# Patient Record
Sex: Female | Born: 1955 | Race: Black or African American | Hispanic: No | Smoking: Never smoker
Health system: Southern US, Community
[De-identification: ages and names within clinical notes are randomized; demographics above are authoritative.]

## PROBLEM LIST (undated history)

## (undated) DIAGNOSIS — W3400XA Accidental discharge from unspecified firearms or gun, initial encounter: Secondary | ICD-10-CM

## (undated) DIAGNOSIS — M722 Plantar fascial fibromatosis: Secondary | ICD-10-CM

## (undated) DIAGNOSIS — R519 Headache, unspecified: Secondary | ICD-10-CM

## (undated) DIAGNOSIS — S0560XA Penetrating wound without foreign body of unspecified eyeball, initial encounter: Secondary | ICD-10-CM

## (undated) DIAGNOSIS — E669 Obesity, unspecified: Secondary | ICD-10-CM

## (undated) DIAGNOSIS — M8580 Other specified disorders of bone density and structure, unspecified site: Secondary | ICD-10-CM

## (undated) DIAGNOSIS — A6000 Herpesviral infection of urogenital system, unspecified: Secondary | ICD-10-CM

## (undated) DIAGNOSIS — R51 Headache: Secondary | ICD-10-CM

## (undated) DIAGNOSIS — G43019 Migraine without aura, intractable, without status migrainosus: Principal | ICD-10-CM

## (undated) DIAGNOSIS — M1990 Unspecified osteoarthritis, unspecified site: Secondary | ICD-10-CM

## (undated) DIAGNOSIS — S161XXA Strain of muscle, fascia and tendon at neck level, initial encounter: Secondary | ICD-10-CM

## (undated) DIAGNOSIS — G43909 Migraine, unspecified, not intractable, without status migrainosus: Secondary | ICD-10-CM

## (undated) DIAGNOSIS — E559 Vitamin D deficiency, unspecified: Secondary | ICD-10-CM

## (undated) DIAGNOSIS — I10 Essential (primary) hypertension: Secondary | ICD-10-CM

## (undated) DIAGNOSIS — H5440 Blindness, one eye, unspecified eye: Secondary | ICD-10-CM

## (undated) HISTORY — DX: Accidental discharge from unspecified firearms or gun, initial encounter: W34.00XA

## (undated) HISTORY — DX: Migraine, unspecified, not intractable, without status migrainosus: G43.909

## (undated) HISTORY — DX: Penetrating wound without foreign body of unspecified eyeball, initial encounter: S05.60XA

## (undated) HISTORY — PX: APPENDECTOMY: SHX54

## (undated) HISTORY — DX: Headache: R51

## (undated) HISTORY — DX: Strain of muscle, fascia and tendon at neck level, initial encounter: S16.1XXA

## (undated) HISTORY — PX: ABDOMINAL HYSTERECTOMY: SHX81

## (undated) HISTORY — DX: Headache, unspecified: R51.9

## (undated) HISTORY — DX: Unspecified osteoarthritis, unspecified site: M19.90

## (undated) HISTORY — DX: Other specified disorders of bone density and structure, unspecified site: M85.80

## (undated) HISTORY — DX: Blindness, one eye, unspecified eye: H54.40

## (undated) HISTORY — DX: Herpesviral infection of urogenital system, unspecified: A60.00

## (undated) HISTORY — DX: Plantar fascial fibromatosis: M72.2

## (undated) HISTORY — DX: Obesity, unspecified: E66.9

## (undated) HISTORY — DX: Vitamin D deficiency, unspecified: E55.9

## (undated) HISTORY — DX: Migraine without aura, intractable, without status migrainosus: G43.019

---

## 1998-08-17 ENCOUNTER — Ambulatory Visit (HOSPITAL_COMMUNITY): Admission: RE | Admit: 1998-08-17 | Discharge: 1998-08-17 | Payer: Self-pay | Admitting: Family Medicine

## 1998-08-17 ENCOUNTER — Encounter: Payer: Self-pay | Admitting: Family Medicine

## 1998-12-14 ENCOUNTER — Inpatient Hospital Stay (HOSPITAL_COMMUNITY): Admission: AD | Admit: 1998-12-14 | Discharge: 1998-12-24 | Payer: Self-pay | Admitting: Obstetrics and Gynecology

## 1998-12-27 ENCOUNTER — Inpatient Hospital Stay (HOSPITAL_COMMUNITY): Admission: AD | Admit: 1998-12-27 | Discharge: 1998-12-27 | Payer: Self-pay | Admitting: Obstetrics and Gynecology

## 1998-12-30 ENCOUNTER — Inpatient Hospital Stay (HOSPITAL_COMMUNITY): Admission: AD | Admit: 1998-12-30 | Discharge: 1999-01-19 | Payer: Self-pay | Admitting: Obstetrics & Gynecology

## 1999-01-12 ENCOUNTER — Encounter: Payer: Self-pay | Admitting: Obstetrics & Gynecology

## 1999-01-17 ENCOUNTER — Encounter: Payer: Self-pay | Admitting: Obstetrics and Gynecology

## 1999-01-20 ENCOUNTER — Encounter (HOSPITAL_COMMUNITY): Admission: RE | Admit: 1999-01-20 | Discharge: 1999-04-20 | Payer: Self-pay | Admitting: *Deleted

## 2000-01-12 ENCOUNTER — Ambulatory Visit (HOSPITAL_COMMUNITY): Admission: RE | Admit: 2000-01-12 | Discharge: 2000-01-12 | Payer: Self-pay | Admitting: Family Medicine

## 2000-01-12 ENCOUNTER — Encounter: Payer: Self-pay | Admitting: Family Medicine

## 2000-05-11 ENCOUNTER — Observation Stay: Admission: EM | Admit: 2000-05-11 | Discharge: 2000-05-13 | Payer: Self-pay | Admitting: Emergency Medicine

## 2000-05-11 ENCOUNTER — Encounter (INDEPENDENT_AMBULATORY_CARE_PROVIDER_SITE_OTHER): Payer: Self-pay | Admitting: Specialist

## 2000-05-11 ENCOUNTER — Encounter: Payer: Self-pay | Admitting: Emergency Medicine

## 2001-01-09 ENCOUNTER — Other Ambulatory Visit: Admission: RE | Admit: 2001-01-09 | Discharge: 2001-01-09 | Payer: Self-pay | Admitting: Obstetrics and Gynecology

## 2001-01-18 ENCOUNTER — Ambulatory Visit (HOSPITAL_COMMUNITY): Admission: RE | Admit: 2001-01-18 | Discharge: 2001-01-18 | Payer: Self-pay | Admitting: Obstetrics and Gynecology

## 2007-12-16 ENCOUNTER — Other Ambulatory Visit: Admission: RE | Admit: 2007-12-16 | Discharge: 2007-12-16 | Payer: Self-pay | Admitting: Family Medicine

## 2008-12-17 ENCOUNTER — Other Ambulatory Visit: Admission: RE | Admit: 2008-12-17 | Discharge: 2008-12-17 | Payer: Self-pay | Admitting: Family Medicine

## 2009-04-27 ENCOUNTER — Observation Stay (HOSPITAL_COMMUNITY): Admission: EM | Admit: 2009-04-27 | Discharge: 2009-04-28 | Payer: Self-pay | Admitting: Emergency Medicine

## 2009-04-28 ENCOUNTER — Encounter (INDEPENDENT_AMBULATORY_CARE_PROVIDER_SITE_OTHER): Payer: Self-pay | Admitting: Internal Medicine

## 2011-03-14 LAB — D-DIMER, QUANTITATIVE: D-Dimer, Quant: 0.27 ug/mL-FEU (ref 0.00–0.48)

## 2011-03-14 LAB — MAGNESIUM: Magnesium: 2.2 mg/dL (ref 1.5–2.5)

## 2011-03-14 LAB — DIFFERENTIAL
Basophils Absolute: 0.1 10*3/uL (ref 0.0–0.1)
Basophils Absolute: 0.1 10*3/uL (ref 0.0–0.1)
Eosinophils Absolute: 0.1 10*3/uL (ref 0.0–0.7)
Eosinophils Relative: 1 % (ref 0–5)
Lymphocytes Relative: 62 % — ABNORMAL HIGH (ref 12–46)
Monocytes Absolute: 0.4 10*3/uL (ref 0.1–1.0)
Neutro Abs: 1.5 10*3/uL — ABNORMAL LOW (ref 1.7–7.7)
Neutrophils Relative %: 28 % — ABNORMAL LOW (ref 43–77)

## 2011-03-14 LAB — BASIC METABOLIC PANEL
CO2: 28 mEq/L (ref 19–32)
GFR calc non Af Amer: >60 mL/min (ref >60)
Glucose, Bld: 111 mg/dL — ABNORMAL HIGH (ref 70–99)
Potassium: 4 mEq/L (ref 3.5–5.1)
Sodium: 144 mEq/L (ref 135–145)

## 2011-03-14 LAB — COMPREHENSIVE METABOLIC PANEL
ALT: 10 U/L (ref 0–35)
AST: 13 U/L (ref 0–37)
CO2: 25 mEq/L (ref 19–32)
Chloride: 108 mEq/L (ref 96–112)
Creatinine, Ser: 1.06 mg/dL (ref 0.4–1.2)
GFR calc Af Amer: >60 mL/min (ref >60)
GFR calc non Af Amer: 55 mL/min — ABNORMAL LOW (ref >60)
Total Bilirubin: 0.5 mg/dL (ref 0.3–1.2)

## 2011-03-14 LAB — IRON AND TIBC
Iron: 84 ug/dL (ref 42–135)
Saturation Ratios: 29 % (ref 20–55)
TIBC: 294 ug/dL (ref 250–470)
UIBC: 210 ug/dL

## 2011-03-14 LAB — LIPID PANEL
HDL: 26 mg/dL — ABNORMAL LOW (ref >39)
LDL Cholesterol: 113 mg/dL — ABNORMAL HIGH (ref 0–99)
Total CHOL/HDL Ratio: 6.1 RATIO
Triglycerides: 95 mg/dL (ref <150)
VLDL: 19 mg/dL (ref 0–40)

## 2011-03-14 LAB — CBC
Hemoglobin: 11 g/dL — ABNORMAL LOW (ref 12.0–15.0)
Hemoglobin: 11.7 g/dL — ABNORMAL LOW (ref 12.0–15.0)
MCV: 85.1 fL (ref 78.0–100.0)
RBC: 4.05 MIL/uL (ref 3.87–5.11)
RDW: 14 % (ref 11.5–15.5)
WBC: 5.4 10*3/uL (ref 4.0–10.5)

## 2011-03-14 LAB — CARDIAC PANEL(CRET KIN+CKTOT+MB+TROPI)
CK, MB: 0.9 ng/mL (ref 0.3–4.0)
Relative Index: 0.6 (ref 0.0–2.5)
Relative Index: 0.7 (ref 0.0–2.5)
Total CK: 147 U/L (ref 7–177)
Troponin I: 0.02 ng/mL (ref 0.00–0.06)

## 2011-03-14 LAB — URINALYSIS, MICROSCOPIC ONLY
Bilirubin Urine: NEGATIVE
Ketones, ur: NEGATIVE mg/dL
Nitrite: NEGATIVE
Urobilinogen, UA: 1 mg/dL (ref 0.0–1.0)

## 2011-03-14 LAB — LIPASE, BLOOD: Lipase: 21 U/L (ref 11–59)

## 2011-03-14 LAB — TSH: TSH: 1.123 u[IU]/mL (ref 0.350–4.500)

## 2011-03-14 LAB — POCT CARDIAC MARKERS
Myoglobin, poc: 75.5 ng/mL (ref 12–200)
Troponin i, poc: <0.05 ng/mL (ref 0.00–0.09)

## 2011-03-14 LAB — URINE CULTURE: Colony Count: >=100000

## 2011-03-14 LAB — BRAIN NATRIURETIC PEPTIDE: Pro B Natriuretic peptide (BNP): <30 pg/mL (ref 0.0–100.0)

## 2011-04-18 NOTE — H&P (Signed)
NAMEAMYRIE, ILLINGWORTH NO.:  [id number]   MEDICAL RECORD NO.:  [id number]          PATIENT TYPE:  EMS   LOCATION:  MAJO                         FACILITY:  [facility]   PHYSICIAN:  Ramiro Harvest, MD    DATE OF BIRTH:  13-Feb-1956   DATE OF ADMISSION:  04/27/2009  DATE OF DISCHARGE:                              HISTORY & PHYSICAL   PRIMARY CARE PHYSICIAN:  Dr. Laurann Montana of [facility].   HISTORY OF PRESENT ILLNESS:  Torii Pinkett is a 55 year old African  American female with history of hypertension, mitral valve prolapse,  gastroesophageal reflux disease status post hysterectomy on hormone  replacement therapy who presented to the ED with a 3-day history of left  substernal chest pain on exertion that was initially described as a  sharp pain and then became a heaviness and a pressure sensation which is  intermittent in nature lasting seconds and radiating to the right side  and a right neck with some radiation to left upper extremity and some  tingling sensation in the left upper extremity. Other associated  symptoms include fatigue, palpitation, shortness of breath, diaphoresis,  burning sensation and water brash, generalized weakness, orthopnea,  paroxysmal nocturnal dyspnea.  The patient denies any fevers,  no  chills, no cough, no abdominal pain, no dysuria, no focal neurological  symptoms.  No recent travel, no recent surgeries.  No heavy lifting.  The patient states that her symptoms have progressed and as such she  called her PCP and was sent to the ED.  No other associated symptoms.  In the ED, the patient was given some nitroglycerin with some relief.  We are called to admit the patient for further evaluation and  recommendations.   ALLERGIES:  PENICILLIN CAUSES A RASH;  SHELLFISH CAUSES SWELLING.   PAST MEDICAL HISTORY:  1. Hypertension.  2. Gastroesophageal reflux disease.  3. Mitral valve prolapse.  4. HSV.  5. Status post appendectomy.  6.  Status post herniorrhaphy, umbilical as a child.  7. Status post hysterectomy 2 years prior to admission.  8. Status post  right foot surgery.   HOME MEDICATIONS:  Need to be verified.  The patient however states she  is on FemHRT 0.5 / 2.5 mg daily for the past 5 months, acyclovir 400 mg  p.o. daily and other medications need to be verified.   SOCIAL HISTORY:  The patient is married, no tobacco use.  No alcohol  use.  No IV drug use.   FAMILY HISTORY:  Mother deceased age 41 from abdominal cancer and  ulcers.  Father deceased age 83 from multiple myeloma. Four brothers  alive, one with prostate cancer.  Three sisters alive with a history of  diabetes and her brothers also have a history of hypertension.   REVIEW OF SYSTEMS:  As per HPI, otherwise negative.   PHYSICAL EXAMINATION:  Temperature 98.5, blood pressure 133/74, pulse of  65, respiratory rate 16, saturating 100% on 2 liters nasal cannula.  GENERAL:  Patient in no apparent distress.  HEENT:  Normocephalic, atraumatic.  Pupils equal, round and reactive to  light and accommodation.  Extraocular movements intact.  Oropharynx is  clear.  No lesions or exudates.  NECK:  Supple.  No lymphadenopathy.  RESPIRATORY:  Lungs are clear to auscultation bilaterally.  No wheezes,  no crackles.  No rhonchi.  CARDIOVASCULAR:  Regular rate and rhythm.  No murmurs, rubs or gallops.  Chest pain is nonreproducible.  ABDOMEN:  Soft, nontender, nondistended.  Positive bowel sounds.  EXTREMITIES:  No clubbing, cyanosis or edema.  NEUROLOGIC:  The patient is alert and oriented x3.  Cranial nerves II  through XII grossly intact. No focal deficits.   ADMISSION LABS:  Comprehensive metabolic profile - Sodium of 140,  potassium 3.4, chloride 108, bicarb 25, glucose 114,  BUN 11, creatinine  1.06, bilirubin 0.5, alk phosphatase 64, AST 13, ALT 10, protein 7.3,  albumin of 3.4, calcium of 8.6.  CBC with a white count of 5.4,  hemoglobin 11.7,  hematocrit 34.5, platelet count of 246, ANC of 2.6, PT  of 14.3, INR of 1.1, D-dimer of 0.27.  Point of care cardiac markers  were negative times three.   Chest x-ray showed no acute cardiopulmonary disease.  EKG with normal  sinus rhythm.   ASSESSMENT AND PLAN:  Ms. Nijah Tejera is a 55 year old African American  female with history of hypertension, gastroesophageal reflux disease,  history of mitral valve prolapse, history of status post hysterectomy on  hormone replacement therapy presenting to the ED with a 3-day history of  worsening left substernal chest pain.   Problem #1.  Chest pain, acute coronary syndrome versus GI versus  pulmonary which is unlikely with a negative chest x-ray, negative D-  dimer.  The patient is afebrile and no cough and asymptomatic.  Will  admit the patient to telemetry.  Cycle cardiac enzymes q.8 h x3.  Check  a TSH.  Check a BNP.  Check a lipase.  Check a magnesium.  Check a UA,  cultures and sensitivities.  Check a 2-D echo to rule out LV  dysfunction.  Check plain films of the C-spine to rule out disk  protrusion.  Will place the patient on oxygen, nitroglycerin, morphine  sulfate, Lovenox and Protonix.  May need a cardiology consult in the  morning for further evaluation and recommendations.  If cardiac workup  is negative, may consider a barium swallow with a pill to rule out  esophageal or  GI sources.  Will monitor for now.  Problem #2.  Hypokalemia.  Check a magnesium level and replete.  Problem #3.  Hypertension.  Follow.  Problem #4.  Gastroesophageal reflux disease.  Protonix.  Problem #5.  Mitral valve prolapse.  Check a 2-D echo and follow.  Problem #6.  History of herpes simplex virus. Acyclovir.  Problem #7.  Status post hysterectomy.  Will hold hormone replacement  therapy for now.  Problem #8.  Prophylaxis.  Protonix for GI prophylaxis.  Lovenox for DVT  prophylaxis.   It has been a pleasure taking care of Ms. Opal Sidles.       Ramiro Harvest, MD  Electronically Signed    DT/MEDQ  D:  04/27/2009  T:  04/27/2009  Job:  [id number]   cc:   Stacie Acres. Cliffton Asters, M.D.

## 2011-04-18 NOTE — Consult Note (Signed)
NAMEEMERLY, PRAK NO.:  [id number]   MEDICAL RECORD NO.:  [id number]          PATIENT TYPE:  INP   LOCATION:  3707                         FACILITY:  [facility]   PHYSICIAN:  Lyn Records, M.D.   DATE OF BIRTH:  [birth year]   DATE OF CONSULTATION:  04/28/2009  DATE OF DISCHARGE:  04/28/2009                                 CONSULTATION   INDICATION:  Chest pain.   CONCLUSIONS:  1. Prolonged left parasternal chest discomfort, radiating across the      sternum, associated at times with a sharp tingling discomfort into      the left arm.  The discomfort was continuous for greater than 48      hours.  She is ruled out for myocardial infarction by enzymes and      has normal wall motion on echo.  Her D-dimer was normal.  2. Hypertension.  3. Gastroesophageal reflux.   RECOMMENDATIONS:  1. Outpatient stress Cardiolite study to rule out myocardial ischemia      given the history of hypertension.  2. May need further GI investigation after cardiac evaluation is      negative.  3. Instructed to return if she begins having significant chest      discomfort after discharge.   COMMENT:  The patient is 68 and starting this past weekend had a  soreness in the left parasternal area that she describes as a  tightness/fullness.  It was continuous for 2-1/2 to 3 days.  On the day  of admission, she experienced an increase in intensity of discomfort.  No significant shortness of breath.  Activity and movement were tend to  increase the significance of the discomfort.  It would also cause her to  limit her inspiratory effort.  She did not have any tachy palpitations,  syncope, lower extremity swelling, orthopnea.   MEDICATIONS ON ADMISSION TO THE HOSPITAL:  Aspirin, Protonix,  triamterene/hydrochlorothiazide 37.5/25 mg.   ALLERGIES:  1. PENICILLIN.  2. SHRIMP.   FAMILY HISTORY:  Negative for CAD, diabetes.   SOCIAL HISTORY:  Does not smoke.  Denies ethanol intake.   PHYSICAL EXAMINATION:  GENERAL:  The patient is obese.  She is in no  acute distress.  VITAL SIGNS:  Blood pressure 110/50, heart rate 55, respiratory rate is  18, O2 saturation 100% on room air.  NECK:  Neck veins not distended.  LUNGS:  Clear.  CARDIAC:  No S4 gallop.  No rubs.  ABDOMEN:  Soft.  EXTREMITIES:  No edema.   EKGs were nonspecific; however, one EKG at 3:00 a.m. on Apr 28, 2009,  demonstrated mildly inverted T waves from V1 through V5.  This is a  pattern very similar to one on an EKG in 2001.  The EKGs were similar  although the admitting EKG looked more normal than subsequent EKGs.  Three sets of cardiac markers were negative.  Echocardiography revealed  an EF of greater than 50% with no significant regional wall motion  abnormality.  The patient's total cholesterol was 158, LDL was 113.  BUN  and creatinine are normal  at 13 and 0.94.  Hemoglobin is 11.   DISCUSSION:  The patient's symptoms are atypical in their continuous  duration with normal markers and no evolutionary EKG changes.  Coronary  artery disease and myocardial ischemia are unlikely causes of this  presentation.  Pulmonary embolism is unlikely given a normal D-dimer in  the absence of hypoxia and dyspnea.  She will probably need further GI  evaluation if her Cardiolite study, which is now planned to be done as  an outpatient, turns up normal.      Lyn Records, M.D.  Electronically Signed     HWS/MEDQ  D:  04/28/2009  T:  04/29/2009  Job:  [id number]   cc:   Stacie Acres. Cliffton Asters, M.D.  Dario Guardian, M.D.

## 2011-04-21 NOTE — Discharge Summary (Signed)
[facility]  Patient:    Krista Santana, Krista Santana                          MRN: [id number] Adm. Date:  01/18/01 Attending:  Fayrene Fearing A. Ashley Royalty, M.D.                           Discharge Summary  HISTORY OF PRESENT ILLNESS:   Patient is a 55 year old, gravida 4, para 3, AB 1 who states a desire for permanent surgical sterilization.  Periods are regular.  MEDICATIONS:                  Hyzaar for hypertension.  PAST MEDICAL HISTORY:         Medical:  Hypertension treated by ______ [facility], Dr. Dayton Scrape.  Surgery:  Appendectomy, herniorrhaphy (umbilical) as a child.  ALLERGIES:                    PENICILLIN - rash.  FAMILY HISTORY:               Positive for colon cancer, hypertension, diabetes.  SOCIAL HISTORY:               The patient denies use of tobacco or alcohol.  REVIEW OF SYSTEMS:            Noncontributory.  PHYSICAL EXAMINATION:  GENERAL:                      A well-developed, well-nourished, pleasant black female in no acute distress, afebrile.  VITAL SIGNS:                  Stable.  SKIN:                         Warm and dry without lesions.  LYMPH:                        There is no supraclavicular, cervical, or inguinal adenopathy.  HEENT:                        Normocephalic.  NECK:                         Supple without thyromegaly.  CHEST/LUNGS:                  Clear.  CARDIAC:                      Regular rate and rhythm without murmurs, gallops, or rubs.  BREASTS:                      Exam deferred.  ABDOMEN:                      Soft and nontender without masses or organomegaly.  The abdomen contains well-healed surgical scars including around her umbilicus which has increased in diameter and also in depth consistent with a history of umbilical herniorrhaphy.  There is also a well-healed surgical scar (longitudinal) above the umbilicus.  No evidence of hernia.  Bowel sounds are active.  MUSCULOSKELETAL:               Examination reveals full range of  motion without edema, cyanosis, or CVA tenderness.  PELVIC:                       Examination deferred until examination under anesthesia.  IMPRESSION:                   1. Hypertension.                               2. History of umbilical hernia.                               3. Desire for tentative permanent surgical                                  sterilization.  PLAN:                         Laparoscopic bilateral tubal sterilization                               procedure.  Risks, benefits, complications, and alternatives were fully discussed with the patient.  Possible need for mini laparotomy and partial salpingectomy discussed and accepted.  Permanency and failure rates of various techniques including but not limited to bipolar cautery, ______ , mini laparotomy with partial salpingectomy were fully discussed and accepted.  Questions invited and answered. DD:  01/17/01 TD:  01/17/01 Job: [id number]

## 2011-04-21 NOTE — Op Note (Signed)
[facility]  Patient:    Krista Santana, Krista Santana                        MRN: [id number] Proc. Date: 05/11/00 Adm. Date:  [id number] Disc. Date: [id number] Attending:  Glenna Fellows Tappan                           Operative Report  PREOPERATIVE DIAGNOSIS:  Acute appendicitis.  POSTOPERATIVE DIAGNOSIS:  Acute appendicitis.  PROCEDURE:  Laparoscopic appendectomy.  SURGEON:  Lorne Skeens. Hoxworth, M.D.  ASSISTANT:  ANESTHESIA:  General.  INDICATIONS:  [name] is a 55 year old black female who presents with two days of worsening right lower quadrant abdominal pain and was found to have marked tenderness on exam.  A CT scan has also been obtained, which reveals suspicion for retrocecal appendicitis.  A laparoscopic appendectomy has been recommended and accepted.  The nature of the procedure, its indications, and risks of bleeding and infection were discussed preoperatively.  DESCRIPTION OF PROCEDURE:  The patient was brought to the operating room and placed in the supine position on the operating table and general endotracheal anesthesia was induced.  A Foley catheter was placed.  She had received broad spectrum antibiotics preoperatively.  PAS were in place.  The abdomen was sterilely prepped and draped.  She had had a previous large umbilical hernia repair and I made a midline 1 cm incision just above the umbilicus. Dissection was carried down to the midline fascia, which was sharply incised for 1 cm and the peritoneum entered under direct vision.  A mattress suture of 0 Vicryl was placed, the Hasson trocar inserted, and pneumoperitoneum established.  Under direct vision, a 5 mm trocar was placed in the right upper quadrant and a 12 mm trocar in the left lower quadrant.  The cecum was identified and the base of the appendix identified.  The base was not inflamed and the appendix was retrocecal, curling back inferior and medial to the cecum.  Using  careful traction, the appendix was mobilized.  I did insert a second 5 mm trocar in the right lower quadrant to aid and in retracting the cecum.  With this the distal appendix could be exposed and was seen to be acutely inflamed.  Peritoneal attachments were sharply divided, freeing the distal appendix and bringing it up into the free peritoneal cavity.  Following this, the mesoappendix was dissected away from the appendix at its base and the appendiceal base was divided with a single firing of the endo GIA 3.5 mm stapler.  Following this, the mesoappendix was quite long and was divided with two firings of the vascular GIA stapler and the specimen placed in an endocatch bag and removed.  The right lower quadrant was irrigated and inspected for hemostasis, which was complete.  The trocars were removed under direct vision and all seen to evacuate the peritoneal cavity.  A pursestring suture was secured at the midline incision.  Skin incisions were closed with interrupted subcuticular 4-0 Monocryl and Steri-Strips.  The sponge, needle, and instrument counts were correct.  A dry sterile dressing was applied.  The patient was taken to recovery in good condition. DD:  05/11/00 TD:  05/15/00 Job: [id number]

## 2011-04-21 NOTE — Op Note (Signed)
[facility]  Patient:    Krista Santana, Krista Santana                          MRN: [id number] Proc. Date: 01/18/01 Attending:  Fayrene Fearing A. Ashley Royalty, M.D.                           Operative Report  PREOPERATIVE DIAGNOSES:       1. Desire for attempt at permanent surgical                                  sterilization.                               2. Status post umbilical herniorrhaphy.  POSTOPERATIVE DIAGNOSES:      1. Desire for attempt at permanent surgical                                  sterilization.                               2. Minimal adhesions to the anterior abdominal                                  wall near the umbilicus.  OPERATION:                    1. Laparoscopic bilateral tubal sterilization                                  procedure (Falope-rings).                               2. Lysis of adhesions.  SURGEON:                      Rudy Jew. Ashley Royalty, M.D.  ANESTHESIA:                   General.  ESTIMATED BLOOD LOSS:         Less than 50 cc.  COMPLICATIONS:                None.  PACKS AND DRAINS:             None.  DESCRIPTION OF PROCEDURE:     Patient was taken to the operating room and placed in the dorsal supine position.  After adequate general anesthesia, she was placed in the lithotomy position and prepped and draped in the usual manner for abdominal and vaginal surgery.  The anterior lip of the cervix was grasped with a single toothed tenaculum.  Jarcho uterine manipulator was placed per cervix and held in place with the tenaculum.  The bladder was drained with the red rubber catheter.                                A longitudinal incision was made in  the skin approximately 3 cm below the umbilicus paying careful attention to avoid the area of the previous surgical repair.  An "open" laparoscopic technique was used in terms of getting into the abdominal cavity such that the layers were individually entered using sharp and blunt  dissection rather than blindly with the trocar.  Upon entering into the abdominal cavity, the open laparoscopic trocar was placed into the abdominal cavity and held in place with stay sutures and 0 Vicryl.  Laparoscope was placed into the abdominal cavity and pneumoperitoneum created with CO2 and maintained throughout the procedure. Next, a 5 mm left lower quadrant suprapubic trocar was placed using direct visualization and transillumination techniques.  The pelvis was then thoroughly surveyed.  The uterus was normal size, shape, and contour without evidence of any fibroids or endometriosis.  The left and right fallopian tubes were normal size, shape, contour, and length with luxuriant fimbria.  The left and right ovaries were normal size, shape, and contour without evidence of any cysts or endometriosis.  The anterior and posterior cul de sacs were clean. The remainder of the peritoneal surfaces were smooth and glistening.                                There were two adhesions from the omentum to the anterior abdominal wall that were attached to the anterior abdominal wall near the umbilicus.  Appropriate photos were obtained.  The adhesions were then cauterized with bipolar cautery and ligated with scissors.  Appropriate photos were obtained afterwards.                                Next, attention was turned to the tubal sterilization procedure.  The 5 mm inferior trocar was removed.  A 7 mm Falope-ring trocar was placed in its previous track.  The Falope-ring applicator was introduced into the abdominal cavity.  The distal isthmic to proximal ampullary portion was chosen for Falope-ring placement.  A Falope-ring was applied without difficulty.  An excellent knuckle of tube was noted to be contained within the ring.  Excellent blanching of tissue was noted.                                Attention was then turned to the contralateral fallopian tube.  An avascular area in the distal  isthmic to proximal ampullary portion was chosen for ring placement.  A Falope-ring was applied without difficulty.  An excellent knuckle of tube was noted to be contained within the ring.  Excellent blanching of tissue was noted.  Appropriate photos were obtained.                                At this point, patient was felt to have benefited maximally from the procedure.  Hence, abdominal instruments were removed and pneumoperitoneum evacuated.  Fascial defects were closed with 0 Vicryl in an interrupted fashion.  The skin was closed with 3-0 chromic in a subcuticular fashion.  Approximately 10 cc of 0.25% Marcaine was instilled into the incision site for postoperative analgesia.  The vaginal instruments were removed.  Hemostasis was noted.  The procedure was terminated.  The patient tolerated the procedure extremely well and was returned to the recovery room in good  condition. DD:  01/18/01 TD:  01/18/01 Job: [id number]

## 2011-08-03 ENCOUNTER — Emergency Department (HOSPITAL_COMMUNITY)
Admission: EM | Admit: 2011-08-03 | Discharge: 2011-08-04 | Disposition: A | Discharge status: Home / Self Care | Payer: BC Managed Care – PPO | Attending: Emergency Medicine | Admitting: Emergency Medicine

## 2011-08-03 ENCOUNTER — Inpatient Hospital Stay (INDEPENDENT_AMBULATORY_CARE_PROVIDER_SITE_OTHER)
Admission: RE | Admit: 2011-08-03 | Discharge: 2011-08-03 | Disposition: A | Discharge status: Home / Self Care | Payer: BC Managed Care – PPO | Source: Ambulatory Visit | Attending: Family Medicine | Admitting: Family Medicine

## 2011-08-03 ENCOUNTER — Emergency Department (HOSPITAL_COMMUNITY): Payer: BC Managed Care – PPO

## 2011-08-03 DIAGNOSIS — J3489 Other specified disorders of nose and nasal sinuses: Secondary | ICD-10-CM | POA: Insufficient documentation

## 2011-08-03 DIAGNOSIS — K219 Gastro-esophageal reflux disease without esophagitis: Secondary | ICD-10-CM | POA: Insufficient documentation

## 2011-08-03 DIAGNOSIS — R110 Nausea: Secondary | ICD-10-CM | POA: Insufficient documentation

## 2011-08-03 DIAGNOSIS — R51 Headache: Secondary | ICD-10-CM | POA: Insufficient documentation

## 2011-08-03 DIAGNOSIS — I10 Essential (primary) hypertension: Secondary | ICD-10-CM | POA: Insufficient documentation

## 2011-08-03 DIAGNOSIS — Z79899 Other long term (current) drug therapy: Secondary | ICD-10-CM | POA: Insufficient documentation

## 2012-09-24 ENCOUNTER — Other Ambulatory Visit: Payer: Self-pay | Admitting: Family Medicine

## 2012-09-24 ENCOUNTER — Other Ambulatory Visit (HOSPITAL_COMMUNITY)
Admission: RE | Admit: 2012-09-24 | Discharge: 2012-09-24 | Disposition: A | Discharge status: Home / Self Care | Payer: BC Managed Care – PPO | Source: Ambulatory Visit | Attending: Family Medicine | Admitting: Family Medicine

## 2012-09-24 DIAGNOSIS — Z113 Encounter for screening for infections with a predominantly sexual mode of transmission: Secondary | ICD-10-CM | POA: Insufficient documentation

## 2012-09-24 DIAGNOSIS — Z01419 Encounter for gynecological examination (general) (routine) without abnormal findings: Secondary | ICD-10-CM | POA: Insufficient documentation

## 2012-10-01 ENCOUNTER — Emergency Department (HOSPITAL_COMMUNITY)
Admission: EM | Admit: 2012-10-01 | Discharge: 2012-10-02 | Disposition: A | Discharge status: Home / Self Care | Payer: BC Managed Care – PPO | Attending: Emergency Medicine | Admitting: Emergency Medicine

## 2012-10-01 ENCOUNTER — Encounter (HOSPITAL_COMMUNITY): Payer: Self-pay | Admitting: Emergency Medicine

## 2012-10-01 ENCOUNTER — Emergency Department (HOSPITAL_COMMUNITY): Payer: BC Managed Care – PPO

## 2012-10-01 DIAGNOSIS — IMO0002 Reserved for concepts with insufficient information to code with codable children: Secondary | ICD-10-CM | POA: Insufficient documentation

## 2012-10-01 DIAGNOSIS — S0550XA Penetrating wound with foreign body of unspecified eyeball, initial encounter: Secondary | ICD-10-CM | POA: Insufficient documentation

## 2012-10-01 DIAGNOSIS — S0501XA Injury of conjunctiva and corneal abrasion without foreign body, right eye, initial encounter: Secondary | ICD-10-CM

## 2012-10-01 DIAGNOSIS — S00209A Unspecified superficial injury of unspecified eyelid and periocular area, initial encounter: Secondary | ICD-10-CM | POA: Insufficient documentation

## 2012-10-01 DIAGNOSIS — S058X9A Other injuries of unspecified eye and orbit, initial encounter: Secondary | ICD-10-CM | POA: Insufficient documentation

## 2012-10-01 DIAGNOSIS — W268XXA Contact with other sharp object(s), not elsewhere classified, initial encounter: Secondary | ICD-10-CM | POA: Insufficient documentation

## 2012-10-01 DIAGNOSIS — T1590XA Foreign body on external eye, part unspecified, unspecified eye, initial encounter: Secondary | ICD-10-CM | POA: Insufficient documentation

## 2012-10-01 DIAGNOSIS — I10 Essential (primary) hypertension: Secondary | ICD-10-CM | POA: Insufficient documentation

## 2012-10-01 DIAGNOSIS — S0560XA Penetrating wound without foreign body of unspecified eyeball, initial encounter: Secondary | ICD-10-CM | POA: Insufficient documentation

## 2012-10-01 HISTORY — DX: Essential (primary) hypertension: I10

## 2012-10-01 LAB — BASIC METABOLIC PANEL
BUN: 16 mg/dL (ref 6–23)
CO2: 27 mEq/L (ref 19–32)
Calcium: 9.3 mg/dL (ref 8.4–10.5)
Chloride: 104 mEq/L (ref 96–112)
Creatinine, Ser: 0.74 mg/dL (ref 0.50–1.10)

## 2012-10-01 LAB — CBC WITH DIFFERENTIAL/PLATELET
Basophils Absolute: 0.1 10*3/uL (ref 0.0–0.1)
Eosinophils Relative: 1 % (ref 0–5)
HCT: 37.6 % (ref 36.0–46.0)
Hemoglobin: 12.5 g/dL (ref 12.0–15.0)
Lymphocytes Relative: 28 % (ref 12–46)
MCHC: 33.2 g/dL (ref 30.0–36.0)
MCV: 84.5 fL (ref 78.0–100.0)
Monocytes Absolute: 0.4 10*3/uL (ref 0.1–1.0)
Monocytes Relative: 7 % (ref 3–12)
Neutro Abs: 4.2 10*3/uL (ref 1.7–7.7)
RDW: 13.9 % (ref 11.5–15.5)
WBC: 6.6 10*3/uL (ref 4.0–10.5)

## 2012-10-01 MED ORDER — MORPHINE SULFATE 4 MG/ML IJ SOLN: 4.0000 mg | Freq: Once | INTRAMUSCULAR | Status: AC

## 2012-10-01 MED ORDER — MORPHINE SULFATE 4 MG/ML IJ SOLN
4.0000 mg | Freq: Once | INTRAMUSCULAR | Status: AC
  Administered 2012-10-01: 4 mg via INTRAVENOUS

## 2012-10-01 MED ORDER — MORPHINE SULFATE 4 MG/ML IJ SOLN
INTRAMUSCULAR | Status: AC
  Administered 2012-10-01: 4 mg via INTRAVENOUS
  Filled 2012-10-01: qty 1

## 2012-10-01 MED ORDER — CIPROFLOXACIN HCL 0.3 % OP SOLN
2.0000 [drp] | OPHTHALMIC | Status: DC
  Administered 2012-10-01 – 2012-10-02 (×6): 2 [drp] via OPHTHALMIC
  Filled 2012-10-01: qty 2.5

## 2012-10-01 MED ORDER — MORPHINE SULFATE 4 MG/ML IJ SOLN
INTRAMUSCULAR | Status: AC
  Administered 2012-10-01: 4 mg
  Filled 2012-10-01: qty 1

## 2012-10-01 MED ORDER — TETRACAINE HCL 0.5 % OP SOLN
1.0000 [drp] | Freq: Once | OPHTHALMIC | Status: AC
  Administered 2012-10-01: 1 [drp] via OPHTHALMIC
  Filled 2012-10-01: qty 2

## 2012-10-01 MED ORDER — FLUORESCEIN SODIUM 1 MG OP STRP
2.0000 | ORAL_STRIP | Freq: Once | OPHTHALMIC | Status: DC
  Filled 2012-10-01: qty 2

## 2012-10-01 MED ORDER — ONDANSETRON HCL 4 MG/2ML IJ SOLN
4.0000 mg | Freq: Once | INTRAMUSCULAR | Status: AC
  Administered 2012-10-01: 4 mg via INTRAVENOUS
  Filled 2012-10-01: qty 2
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
## 2012-10-01 NOTE — ED Notes (Addendum)
Pt states that her eyes feel like they are full of glass and its very uncomfortable, pt also stated she feels like there is glass inside her mouth.
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
## 2012-10-02 ENCOUNTER — Ambulatory Visit: Admit: 2012-10-02 | Payer: Self-pay | Admitting: Ophthalmology

## 2012-10-02 ENCOUNTER — Encounter (HOSPITAL_COMMUNITY): Payer: Self-pay | Admitting: Anesthesiology

## 2012-10-02 ENCOUNTER — Emergency Department (HOSPITAL_COMMUNITY): Payer: BC Managed Care – PPO | Admitting: Anesthesiology

## 2012-10-02 ENCOUNTER — Encounter (HOSPITAL_COMMUNITY)
Admission: EM | Disposition: A | Discharge status: Home / Self Care | Payer: Self-pay | Source: Home / Self Care | Attending: Emergency Medicine

## 2012-10-02 HISTORY — PX: CORNEAL LACERATION REPAIR: SHX5331

## 2012-10-02 HISTORY — PX: EYE EXAMINATION UNDER ANESTHESIA: SHX1560

## 2012-10-02 SURGERY — REPAIR, LACERATION, CORNEA
Anesthesia: General | Site: Eye | Laterality: Right | Wound class: Clean

## 2012-10-02 MED ORDER — MORPHINE SULFATE 4 MG/ML IJ SOLN
4.0000 mg | Freq: Once | INTRAMUSCULAR | Status: AC
  Administered 2012-10-02: 4 mg via INTRAVENOUS
  Filled 2012-10-02: qty 1

## 2012-10-02 MED ORDER — VANCOMYCIN SUBCONJUNCTIVAL INJECTION 25 MG/0.5 ML
INTRAOCULAR | Status: DC | PRN
  Administered 2012-10-02: 50 mg via SUBCONJUNCTIVAL

## 2012-10-02 MED ORDER — VANCOMYCIN SUBCONJUNCTIVAL INJECTION 25 MG/0.5 ML
50.0000 mg | INTRAOCULAR | Status: DC
  Filled 2012-10-02: qty 1

## 2012-10-02 MED ORDER — TROPICAMIDE 1 % OP SOLN
1.0000 [drp] | Freq: Once | OPHTHALMIC | Status: DC
  Filled 2012-10-02: qty 2

## 2012-10-02 MED ORDER — TRIAMCINOLONE ACETONIDE 40 MG/ML IJ SUSP
INTRAMUSCULAR | Status: AC
  Filled 2012-10-02: qty 1

## 2012-10-02 MED ORDER — BUPIVACAINE HCL (PF) 0.75 % IJ SOLN
INTRAMUSCULAR | Status: DC | PRN
  Administered 2012-10-02: 5 mL

## 2012-10-02 MED ORDER — NA CHONDROIT SULF-NA HYALURON 40-30 MG/ML IO SOLN
INTRAOCULAR | Status: DC | PRN
  Administered 2012-10-02: 0.5 mL via INTRAOCULAR

## 2012-10-02 MED ORDER — SUCCINYLCHOLINE CHLORIDE 20 MG/ML IJ SOLN
INTRAMUSCULAR | Status: DC | PRN
  Administered 2012-10-02: 100 mg via INTRAVENOUS

## 2012-10-02 MED ORDER — BSS IO SOLN
INTRAOCULAR | Status: DC | PRN
  Administered 2012-10-02 (×2): 15 mL via INTRAOCULAR

## 2012-10-02 MED ORDER — VANCOMYCIN HCL 1000 MG IV SOLR
1000.0000 mg | Freq: Once | INTRAVENOUS | Status: AC
  Administered 2012-10-02: 1000 mg via INTRAVENOUS
  Filled 2012-10-02: qty 1000

## 2012-10-02 MED ORDER — NA CHONDROIT SULF-NA HYALURON 40-30 MG/ML IO SOLN
INTRAOCULAR | Status: AC
  Filled 2012-10-02: qty 0.5

## 2012-10-02 MED ORDER — EPHEDRINE SULFATE 50 MG/ML IJ SOLN
INTRAMUSCULAR | Status: DC | PRN
  Administered 2012-10-02 (×4): 5 mg via INTRAVENOUS

## 2012-10-02 MED ORDER — VANCOMYCIN HCL IN DEXTROSE 1-5 GM/200ML-% IV SOLN
INTRAVENOUS | Status: AC
  Filled 2012-10-02: qty 200

## 2012-10-02 MED ORDER — PROPOFOL 10 MG/ML IV BOLUS
INTRAVENOUS | Status: DC | PRN
  Administered 2012-10-02: 20 mg via INTRAVENOUS
  Administered 2012-10-02: 50 mg via INTRAVENOUS
  Administered 2012-10-02: 30 mg via INTRAVENOUS
  Administered 2012-10-02: 50 mg via INTRAVENOUS
  Administered 2012-10-02: 150 mg via INTRAVENOUS

## 2012-10-02 MED ORDER — FENTANYL CITRATE 0.05 MG/ML IJ SOLN
INTRAMUSCULAR | Status: DC | PRN
  Administered 2012-10-02: 100 ug via INTRAVENOUS

## 2012-10-02 MED ORDER — HYPROMELLOSE (GONIOSCOPIC) 2.5 % OP SOLN
OPHTHALMIC | Status: AC
  Filled 2012-10-02: qty 15

## 2012-10-02 MED ORDER — LIDOCAINE HCL (CARDIAC) 20 MG/ML IV SOLN
INTRAVENOUS | Status: DC | PRN
  Administered 2012-10-02: 20 mg via INTRAVENOUS

## 2012-10-02 MED ORDER — MIDAZOLAM HCL 5 MG/5ML IJ SOLN
INTRAMUSCULAR | Status: DC | PRN
  Administered 2012-10-02: 1 mg via INTRAVENOUS

## 2012-10-02 MED ORDER — LACTATED RINGERS IV SOLN
INTRAVENOUS | Status: DC
  Administered 2012-10-02: 12:00:00 via INTRAVENOUS

## 2012-10-02 MED ORDER — GATIFLOXACIN 0.5 % OP SOLN
1.0000 [drp] | OPHTHALMIC | Status: DC | PRN
  Filled 2012-10-02: qty 2.5

## 2012-10-02 MED ORDER — BSS IO SOLN
INTRAOCULAR | Status: AC
  Filled 2012-10-02: qty 15

## 2012-10-02 MED ORDER — DEXAMETHASONE SODIUM PHOSPHATE 10 MG/ML IJ SOLN
INTRAMUSCULAR | Status: AC
  Filled 2012-10-02: qty 1

## 2012-10-02 MED ORDER — BSS IO SOLN
INTRAOCULAR | Status: AC
  Filled 2012-10-02: qty 500

## 2012-10-02 MED ORDER — TRIAMCINOLONE ACETONIDE 40 MG/ML IJ SUSP
INTRAMUSCULAR | Status: DC | PRN
  Administered 2012-10-02: 40 mg

## 2012-10-02 MED ORDER — MORPHINE SULFATE 4 MG/ML IJ SOLN
4.0000 mg | Freq: Once | INTRAMUSCULAR | Status: AC
  Administered 2012-10-02: 4 mg via INTRAVENOUS
  Filled 2012-10-02 (×2): qty 1

## 2012-10-02 MED ORDER — TROPICAMIDE 1 % OP SOLN
OPHTHALMIC | Status: DC | PRN
  Administered 2012-10-02: 2 [drp] via OPHTHALMIC

## 2012-10-02 MED ORDER — PREDNISOLONE ACETATE 1 % OP SUSP
1.0000 [drp] | OPHTHALMIC | Status: DC
  Filled 2012-10-02: qty 1

## 2012-10-02 MED ORDER — CEFAZOLIN SODIUM-DEXTROSE 2-3 GM-% IV SOLR
INTRAVENOUS | Status: AC
  Filled 2012-10-02: qty 50

## 2012-10-02 MED ORDER — VANCOMYCIN HCL IN DEXTROSE 1-5 GM/200ML-% IV SOLN
1000.0000 mg | Freq: Two times a day (BID) | INTRAVENOUS | Status: DC
  Filled 2012-10-02 (×2): qty 200

## 2012-10-02 MED ORDER — BACITRACIN-POLYMYXIN B 500-10000 UNIT/GM OP OINT
TOPICAL_OINTMENT | OPHTHALMIC | Status: AC
  Filled 2012-10-02: qty 7

## 2012-10-02 MED ORDER — BACITRACIN-POLYMYXIN B 500-10000 UNIT/GM OP OINT
TOPICAL_OINTMENT | OPHTHALMIC | Status: DC | PRN
  Administered 2012-10-02 (×2): 1 via OPHTHALMIC

## 2012-10-02 MED ORDER — VANCOMYCIN HCL 1000 MG IV SOLR
1000.0000 mg | Freq: Two times a day (BID) | INTRAVENOUS | Status: DC
  Filled 2012-10-02 (×2): qty 1000

## 2012-10-02 MED ORDER — PHENYLEPHRINE HCL 2.5 % OP SOLN
1.0000 [drp] | OPHTHALMIC | Status: DC | PRN
  Filled 2012-10-02 (×2): qty 3

## 2012-10-02 MED ORDER — ONDANSETRON HCL 4 MG/2ML IJ SOLN
INTRAMUSCULAR | Status: DC | PRN
  Administered 2012-10-02: 4 mg via INTRAVENOUS

## 2012-10-02 MED ORDER — TETRACAINE HCL 0.5 % OP SOLN
2.0000 [drp] | OPHTHALMIC | Status: DC
  Filled 2012-10-02: qty 2

## 2012-10-02 SURGICAL SUPPLY — 34 items
APPLICATOR COTTON TIP 6IN STRL (MISCELLANEOUS) ×3 IMPLANT
BAG FLD CLT MN 6.25X3.5 (WOUND CARE) ×2
BAG MINI COLL DRAIN (WOUND CARE) ×3 IMPLANT
CARTRIDGE LENS MTC 60C NO CHG (INSTRUMENTS) ×3 IMPLANT
CLOTH BEACON ORANGE TIMEOUT ST (SAFETY) ×3 IMPLANT
DRAPE OPHTHALMIC 77X100 STRL (CUSTOM PROCEDURE TRAY) ×3 IMPLANT
DRAPE POUCH INSTRU U-SHP 10X18 (DRAPES) ×3 IMPLANT
DRSG TEGADERM 4X4.75 (GAUZE/BANDAGES/DRESSINGS) ×3 IMPLANT
GLOVE SS BIOGEL STRL SZ 6.5 (GLOVE) ×2 IMPLANT
GLOVE SUPERSENSE BIOGEL SZ 6.5 (GLOVE) ×1
GLOVE SURG SS PI 6.5 STRL IVOR (GLOVE) ×3 IMPLANT
GLOVE SURG SS PI 8.0 STRL IVOR (GLOVE) ×3 IMPLANT
GOWN PREVENTION PLUS XLARGE (GOWN DISPOSABLE) ×6 IMPLANT
GOWN SRG XL XLNG 56XLVL 4 (GOWN DISPOSABLE) ×4 IMPLANT
GOWN STRL NON-REIN LRG LVL3 (GOWN DISPOSABLE) ×3 IMPLANT
GOWN STRL NON-REIN XL XLG LVL4 (GOWN DISPOSABLE) ×4
KIT BASIN OR (CUSTOM PROCEDURE TRAY) ×3 IMPLANT
KNIFE GRIESHABER SHARP 2.5MM (MISCELLANEOUS) ×3 IMPLANT
LENS CONTACT P50 8.4 DAY/NIGHT (INTRAOCULAR LENS) ×3 IMPLANT
MASK EYE SHIELD (GAUZE/BANDAGES/DRESSINGS) ×3 IMPLANT
NEEDLE 22X1 1/2 (OR ONLY) (NEEDLE) ×3 IMPLANT
NEEDLE 25GX 5/8IN NON SAFETY (NEEDLE) ×3 IMPLANT
NEEDLE HYPO 30X.5 LL (NEEDLE) ×3 IMPLANT
NS IRRIG 1000ML POUR BTL (IV SOLUTION) ×3 IMPLANT
PACK CATARACT MCHSCP (PACKS) ×3 IMPLANT
PAD ARMBOARD 7.5X6 YLW CONV (MISCELLANEOUS) ×3 IMPLANT
PAD EYE OVAL STERILE LF (GAUZE/BANDAGES/DRESSINGS) ×3 IMPLANT
SHARPOINT NYLON 10-0 ×3 IMPLANT
SPEAR EYE SURG WECK-CEL (MISCELLANEOUS) ×3 IMPLANT
TAPE SURG TRANSPORE 1 IN (GAUZE/BANDAGES/DRESSINGS) ×2 IMPLANT
TAPE SURGICAL TRANSPORE 1 IN (GAUZE/BANDAGES/DRESSINGS) ×1
TOWEL OR 17X24 6PK STRL BLUE (TOWEL DISPOSABLE) ×6 IMPLANT
WATER STERILE IRR 1000ML POUR (IV SOLUTION) ×3 IMPLANT
WIPE INSTRUMENT VISIWIPE 73X73 (MISCELLANEOUS) ×3 IMPLANT

## 2012-10-02 NOTE — Op Note (Signed)
Krista Santana 10/02/2012 Cataract: Combined, Nuclear  Procedure:Repair of Corneal Lacerations OD, Exam under anesthesia OU Operative Eye:  Right eye for Lacerations, Both Eyes for exam under anesthesia  Surgeon: Shade Flood Estimated Blood Loss: minimal Specimens for Pathology:  None Complications: none  The patient was prepared and draped in the usual manner for ocular surgery on the both eyes. A Cook lid speculum was placed OD. The  Cornea was inspected along with the conjunctiva. She had multiple small refractile fragments partially imbedded in the cornea. Some were superficial and three were deeper and imbedded in small partial thickness lacerations. She had an inferior shelving laceration which penetrated down to Descemet's membrane. There were no fragments in the anterior chamber. A 30g needle was used  to remove the fragmenst from the superficial and deep wounds. Fragments of eye lashes were also found in the deep wounds and were removed with McPherson forceps.  The conjunctiva was explored and mutltiple small lacerations were noted, but all were superficial with tenons capsule intact. No conjunctival fragments were identified. Sutures were plced in the four deeper wounds with three in the shelved laceration. A bandage contact lens was placed.  Vancomycin 50 mg was given sub-conjunctivally with the needle visualized. She was also given Sub-tenons Kenalog 40mg . 5cc of Marcaine 0.5% was given as a retrobulbar block. Examination with the indirect Ophthalmascope was normal, without retina contusion of hemorrhage noted. A plasic shield was placed OD  The drapes were removed and a lid speculum was placed OS. The cornea did not have any lacerations. She had sub-conjunctival hemorrhage , but no lacerations were noted. Fundusexam was also normal. Polymyxin/Bacitracn Ophthalmic ointment was plaed on the surface OS. She was extubated uneventfully and transferred with stable vitals signs to the  post-operative recovery area.   Shade Flood, MD

## 2012-10-02 NOTE — ED Notes (Signed)
opthamology has finished eval and will be taking pt to or around 1230. Pt back in room and has ambulated to the rest room

## 2012-10-02 NOTE — Preoperative (Signed)
Beta Blockers   Reason not to administer Beta Blockers:Not Applicable 

## 2012-10-02 NOTE — ED Notes (Signed)
Assisted pt with ambulating to restroom, pt tolerated procedure well. Plan of care is updated with verbal understanding and will continue to monitor pt. Pt denies any new pain or complaints at this time. Pt is awaiting MD optimalogist for pt evaluation.

## 2012-10-02 NOTE — Anesthesia Preprocedure Evaluation (Signed)
Anesthesia Evaluation  Patient identified by MRN, date of birth, ID band Patient awake    Reviewed: Allergy & Precautions, H&P , NPO status , Patient's Chart, lab work & pertinent test results  History of Anesthesia Complications Negative for: history of anesthetic complications  Airway Mallampati: I TM Distance: >3 FB Neck ROM: Full    Dental No notable dental hx. (+) Teeth Intact and Dental Advisory Given   Pulmonary neg pulmonary ROS,  breath sounds clear to auscultation  Pulmonary exam normal       Cardiovascular hypertension, Pt. on medications + Valvular Problems/Murmurs (patient reports pulmonic valve murmur as a child) Rhythm:Regular Rate:Normal     Neuro/Psych negative neurological ROS  negative psych ROS   GI/Hepatic Neg liver ROS, GERD-  Controlled,Nauseated    Endo/Other  negative endocrine ROS  Renal/GU negative Renal ROS     Musculoskeletal   Abdominal (+) + obese,   Peds  Hematology   Anesthesia Other Findings   Reproductive/Obstetrics                           Anesthesia Physical Anesthesia Plan  ASA: II and Emergent  Anesthesia Plan: General   Post-op Pain Management:    Induction: Intravenous and Rapid sequence  Airway Management Planned: Oral ETT  Additional Equipment:   Intra-op Plan:   Post-operative Plan: Extubation in OR  Informed Consent: I have reviewed the patients History and Physical, chart, labs and discussed the procedure including the risks, benefits and alternatives for the proposed anesthesia with the patient or authorized representative who has indicated his/her understanding and acceptance.   Dental advisory given  Plan Discussed with: CRNA and Surgeon  Anesthesia Plan Comments: (Plan routine monitors, GETA with RSI)        Anesthesia Quick Evaluation

## 2012-10-02 NOTE — ED Provider Notes (Signed)
Medical screening examination/treatment/procedure(s) were performed by non-physician practitioner and as supervising physician I was immediately available for consultation/collaboration.  Monea Pesantez R. Gussie Towson, MD 10/02/12 1645 

## 2012-10-02 NOTE — Anesthesia Postprocedure Evaluation (Signed)
Anesthesia Post Note  Patient: Krista Santana  Procedure(s) Performed: Procedure(s) (LRB): CORNEAL LACERATION REPAIR (Right) EYE EXAM UNDER ANESTHESIA (Bilateral)  Anesthesia type: MAC  Patient location: PACU  Post pain: Pain level controlled  Post assessment: Patient's Cardiovascular Status Stable  Last Vitals:  Filed Vitals:   10/02/12 1441  BP:   Pulse:   Temp: 36.7 C  Resp:     Post vital signs: Reviewed and stable  Level of consciousness: sedated  Complications: No apparent anesthesia complications

## 2012-10-02 NOTE — Transfer of Care (Signed)
Immediate Anesthesia Transfer of Care Note  Patient: Krista Santana  Procedure(s) Performed: Procedure(s) (LRB) with comments: CORNEAL LACERATION REPAIR (Right) EYE EXAM UNDER ANESTHESIA (Bilateral)  Patient Location: PACU  Anesthesia Type:General  Level of Consciousness: awake and alert   Airway & Oxygen Therapy: Patient Spontanous Breathing and Patient connected to nasal cannula oxygen  Post-op Assessment: Report given to PACU RN  Post vital signs: Reviewed and stable  Complications: No apparent anesthesia complications
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
## 2012-10-02 NOTE — ED Notes (Signed)
Pt transported to eye room

## 2012-10-02 NOTE — Anesthesia Procedure Notes (Signed)
Procedure Name: Intubation Date/Time: 10/02/2012 12:59 PM Performed by: Jefm Miles E Pre-anesthesia Checklist: Patient identified, Timeout performed, Emergency Drugs available, Suction available and Patient being monitored Patient Re-evaluated:Patient Re-evaluated prior to inductionOxygen Delivery Method: Circle system utilized Preoxygenation: Pre-oxygenation with 100% oxygen Intubation Type: IV induction and Rapid sequence Laryngoscope Size: Mac and 3 Grade View: Grade I Tube type: Oral Tube size: 7.0 mm Number of attempts: 1 Airway Equipment and Method: Stylet Placement Confirmation: ETT inserted through vocal cords under direct vision,  breath sounds checked- equal and bilateral and positive ETCO2 Secured at: 21 cm Tube secured with: Tape Dental Injury: Teeth and Oropharynx as per pre-operative assessment

## 2012-10-03 ENCOUNTER — Encounter (HOSPITAL_COMMUNITY): Payer: Self-pay | Admitting: Ophthalmology

## 2012-10-03 NOTE — Anesthesia Postprocedure Evaluation (Signed)
  Anesthesia Post-op Note  Patient: Krista Santana  Procedure(s) Performed: Procedure(s) (LRB) with comments: CORNEAL LACERATION REPAIR (Right) EYE EXAM UNDER ANESTHESIA (Bilateral)  Patient Location: PACU  Anesthesia Type:General  Level of Consciousness: awake, alert  and oriented  Airway and Oxygen Therapy: Patient Spontanous Breathing  Post-op Pain: mild  Post-op Assessment: Post-op Vital signs reviewed  Post-op Vital Signs: Reviewed  Complications: No apparent anesthesia complications and note written on 10/03/12 but pt was seen and assessed on 10/02/12

## 2013-01-22 DIAGNOSIS — H179 Unspecified corneal scar and opacity: Secondary | ICD-10-CM | POA: Insufficient documentation

## 2013-01-24 ENCOUNTER — Encounter: Payer: Self-pay | Admitting: Neurology

## 2013-01-24 DIAGNOSIS — H5440 Blindness, one eye, unspecified eye: Secondary | ICD-10-CM

## 2013-01-24 DIAGNOSIS — W3400XA Accidental discharge from unspecified firearms or gun, initial encounter: Secondary | ICD-10-CM | POA: Insufficient documentation

## 2013-01-24 DIAGNOSIS — R519 Headache, unspecified: Secondary | ICD-10-CM | POA: Insufficient documentation

## 2013-01-25 ENCOUNTER — Encounter: Payer: Self-pay | Admitting: Neurology

## 2013-02-20 ENCOUNTER — Encounter: Payer: Self-pay | Admitting: Neurology

## 2013-02-21 ENCOUNTER — Ambulatory Visit: Payer: Self-pay | Admitting: Neurology

## 2013-02-24 ENCOUNTER — Encounter: Payer: Self-pay | Admitting: Neurology

## 2013-02-24 ENCOUNTER — Ambulatory Visit (INDEPENDENT_AMBULATORY_CARE_PROVIDER_SITE_OTHER): Payer: BC Managed Care – PPO | Admitting: Neurology

## 2013-02-24 VITALS — BP 130/84 | HR 58 | Ht 66.0 in | Wt 183.0 lb

## 2013-02-24 DIAGNOSIS — R519 Headache, unspecified: Secondary | ICD-10-CM

## 2013-02-24 DIAGNOSIS — H5440 Blindness, one eye, unspecified eye: Secondary | ICD-10-CM

## 2013-02-24 DIAGNOSIS — W3400XA Accidental discharge from unspecified firearms or gun, initial encounter: Secondary | ICD-10-CM

## 2013-02-24 DIAGNOSIS — R51 Headache: Secondary | ICD-10-CM

## 2013-02-24 MED ORDER — RIBOFLAVIN 100 MG PO TABS: 100.0000 mg | ORAL_TABLET | Freq: Two times a day (BID) | ORAL | Status: DC

## 2013-02-24 MED ORDER — MAGNESIUM OXIDE 400 MG PO TABS: 400.0000 mg | ORAL_TABLET | Freq: Two times a day (BID) | ORAL | Status: DC
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
## 2013-07-05 ENCOUNTER — Emergency Department (HOSPITAL_COMMUNITY)
Admission: EM | Admit: 2013-07-05 | Discharge: 2013-07-05 | Disposition: A | Discharge status: Home / Self Care | Payer: Medicaid Other | Attending: Emergency Medicine | Admitting: Emergency Medicine

## 2013-07-05 DIAGNOSIS — IMO0002 Reserved for concepts with insufficient information to code with codable children: Secondary | ICD-10-CM | POA: Insufficient documentation

## 2013-07-05 DIAGNOSIS — Z79899 Other long term (current) drug therapy: Secondary | ICD-10-CM | POA: Insufficient documentation

## 2013-07-05 DIAGNOSIS — Y929 Unspecified place or not applicable: Secondary | ICD-10-CM | POA: Insufficient documentation

## 2013-07-05 DIAGNOSIS — R0789 Other chest pain: Secondary | ICD-10-CM | POA: Insufficient documentation

## 2013-07-05 DIAGNOSIS — R1310 Dysphagia, unspecified: Secondary | ICD-10-CM

## 2013-07-05 DIAGNOSIS — Z87828 Personal history of other (healed) physical injury and trauma: Secondary | ICD-10-CM | POA: Insufficient documentation

## 2013-07-05 DIAGNOSIS — I10 Essential (primary) hypertension: Secondary | ICD-10-CM | POA: Insufficient documentation

## 2013-07-05 DIAGNOSIS — R070 Pain in throat: Secondary | ICD-10-CM | POA: Insufficient documentation

## 2013-07-05 DIAGNOSIS — Z90710 Acquired absence of both cervix and uterus: Secondary | ICD-10-CM | POA: Insufficient documentation

## 2013-07-05 DIAGNOSIS — Z8669 Personal history of other diseases of the nervous system and sense organs: Secondary | ICD-10-CM | POA: Insufficient documentation

## 2013-07-05 DIAGNOSIS — Z9889 Other specified postprocedural states: Secondary | ICD-10-CM | POA: Insufficient documentation

## 2013-07-05 DIAGNOSIS — J029 Acute pharyngitis, unspecified: Secondary | ICD-10-CM | POA: Insufficient documentation

## 2013-07-05 DIAGNOSIS — Z880 Allergy status to penicillin: Secondary | ICD-10-CM | POA: Insufficient documentation

## 2013-07-05 DIAGNOSIS — Y9389 Activity, other specified: Secondary | ICD-10-CM | POA: Insufficient documentation

## 2013-07-05 MED ORDER — ALUM & MAG HYDROXIDE-SIMETH 400-400-40 MG/5ML PO SUSP: 5.0000 mL | Freq: Four times a day (QID) | ORAL | Status: DC | PRN

## 2013-07-05 MED ORDER — LIDOCAINE VISCOUS 2 % MT SOLN: 20.0000 mL | OROMUCOSAL | Status: DC | PRN

## 2013-07-05 MED ORDER — DIPHENHYDRAMINE HCL 12.5 MG/5ML PO SYRP: 12.5000 mg | ORAL_SOLUTION | Freq: Four times a day (QID) | ORAL | Status: DC | PRN
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
## 2013-07-05 NOTE — ED Notes (Signed)
Pt states she was taking her pills on Thursday night and her pills got stuck in her throat. Pt states it still feels like pills are in her throat. Pt c/o painful swallowing. Pt states she has been able to eat and drink since, but states it feels like her throat is "contracting" after she swallow. Pt with no acute distress. Skin warm, dry. Pt ambulatory to exam room with steady gait.

## 2013-07-07 ENCOUNTER — Encounter: Payer: Self-pay | Admitting: Gastroenterology

## 2013-07-08 NOTE — ED Provider Notes (Signed)
Medical screening examination/treatment/procedure(s) were performed by non-physician practitioner and as supervising physician I was immediately available for consultation/collaboration.    Gilda Crease, MD 07/08/13 [id number]

## 2013-08-06 ENCOUNTER — Ambulatory Visit: Payer: No Typology Code available for payment source | Admitting: Gastroenterology

## 2013-08-20 DIAGNOSIS — H2510 Age-related nuclear cataract, unspecified eye: Secondary | ICD-10-CM | POA: Insufficient documentation

## 2013-09-15 ENCOUNTER — Ambulatory Visit: Payer: BC Managed Care – PPO | Admitting: Neurology

## 2015-01-26 ENCOUNTER — Ambulatory Visit
Admission: RE | Admit: 2015-01-26 | Discharge: 2015-01-26 | Disposition: A | Discharge status: Home / Self Care | Payer: 59 | Source: Ambulatory Visit | Attending: Family Medicine | Admitting: Family Medicine

## 2015-01-26 ENCOUNTER — Other Ambulatory Visit: Payer: Self-pay | Admitting: Family Medicine

## 2015-01-26 DIAGNOSIS — R609 Edema, unspecified: Secondary | ICD-10-CM

## 2015-01-26 MED ORDER — IOHEXOL 300 MG/ML  SOLN
75.0000 mL | Freq: Once | INTRAMUSCULAR | Status: AC | PRN
  Administered 2015-01-26: 75 mL via INTRAVENOUS

## 2015-06-15 DIAGNOSIS — M170 Bilateral primary osteoarthritis of knee: Secondary | ICD-10-CM | POA: Diagnosis not present

## 2015-06-15 DIAGNOSIS — I10 Essential (primary) hypertension: Secondary | ICD-10-CM | POA: Diagnosis not present

## 2015-06-15 DIAGNOSIS — G43909 Migraine, unspecified, not intractable, without status migrainosus: Secondary | ICD-10-CM | POA: Diagnosis not present

## 2015-07-14 DIAGNOSIS — M170 Bilateral primary osteoarthritis of knee: Secondary | ICD-10-CM | POA: Diagnosis not present

## 2015-11-13 DIAGNOSIS — R9431 Abnormal electrocardiogram [ECG] [EKG]: Secondary | ICD-10-CM | POA: Diagnosis not present

## 2015-11-13 DIAGNOSIS — I110 Hypertensive heart disease with heart failure: Secondary | ICD-10-CM | POA: Diagnosis not present

## 2015-11-13 DIAGNOSIS — F321 Major depressive disorder, single episode, moderate: Secondary | ICD-10-CM | POA: Diagnosis not present

## 2015-11-13 DIAGNOSIS — I340 Nonrheumatic mitral (valve) insufficiency: Secondary | ICD-10-CM | POA: Diagnosis not present

## 2015-11-13 DIAGNOSIS — I361 Nonrheumatic tricuspid (valve) insufficiency: Secondary | ICD-10-CM | POA: Diagnosis not present

## 2015-11-13 DIAGNOSIS — I5032 Chronic diastolic (congestive) heart failure: Secondary | ICD-10-CM | POA: Diagnosis not present

## 2015-11-13 DIAGNOSIS — Z6829 Body mass index (BMI) 29.0-29.9, adult: Secondary | ICD-10-CM | POA: Diagnosis not present

## 2015-11-13 DIAGNOSIS — Z0000 Encounter for general adult medical examination without abnormal findings: Secondary | ICD-10-CM | POA: Diagnosis not present

## 2016-01-21 DIAGNOSIS — F329 Major depressive disorder, single episode, unspecified: Secondary | ICD-10-CM | POA: Diagnosis not present

## 2016-01-21 DIAGNOSIS — R51 Headache: Secondary | ICD-10-CM | POA: Diagnosis not present

## 2016-01-21 DIAGNOSIS — R079 Chest pain, unspecified: Secondary | ICD-10-CM | POA: Diagnosis not present

## 2016-01-21 DIAGNOSIS — F419 Anxiety disorder, unspecified: Secondary | ICD-10-CM | POA: Diagnosis not present

## 2016-01-21 DIAGNOSIS — I10 Essential (primary) hypertension: Secondary | ICD-10-CM | POA: Diagnosis not present

## 2016-01-21 DIAGNOSIS — H539 Unspecified visual disturbance: Secondary | ICD-10-CM | POA: Diagnosis not present

## 2016-01-28 DIAGNOSIS — R6889 Other general symptoms and signs: Secondary | ICD-10-CM | POA: Diagnosis not present

## 2016-01-28 DIAGNOSIS — R52 Pain, unspecified: Secondary | ICD-10-CM | POA: Diagnosis not present

## 2016-01-28 DIAGNOSIS — J101 Influenza due to other identified influenza virus with other respiratory manifestations: Secondary | ICD-10-CM | POA: Diagnosis not present

## 2016-02-12 DIAGNOSIS — R079 Chest pain, unspecified: Secondary | ICD-10-CM | POA: Diagnosis not present

## 2016-02-12 DIAGNOSIS — R0789 Other chest pain: Secondary | ICD-10-CM | POA: Diagnosis not present

## 2016-03-20 DIAGNOSIS — H5441 Blindness, right eye, normal vision left eye: Secondary | ICD-10-CM | POA: Diagnosis not present

## 2016-03-20 DIAGNOSIS — H1789 Other corneal scars and opacities: Secondary | ICD-10-CM | POA: Diagnosis not present

## 2016-03-20 DIAGNOSIS — H524 Presbyopia: Secondary | ICD-10-CM | POA: Diagnosis not present

## 2016-03-20 DIAGNOSIS — H5711 Ocular pain, right eye: Secondary | ICD-10-CM | POA: Diagnosis not present

## 2016-05-05 DIAGNOSIS — A6000 Herpesviral infection of urogenital system, unspecified: Secondary | ICD-10-CM | POA: Diagnosis not present

## 2016-05-05 DIAGNOSIS — F329 Major depressive disorder, single episode, unspecified: Secondary | ICD-10-CM | POA: Diagnosis not present

## 2016-05-05 DIAGNOSIS — M21619 Bunion of unspecified foot: Secondary | ICD-10-CM | POA: Diagnosis not present

## 2016-08-18 DIAGNOSIS — F329 Major depressive disorder, single episode, unspecified: Secondary | ICD-10-CM | POA: Diagnosis not present

## 2016-08-18 DIAGNOSIS — H02823 Cysts of right eye, unspecified eyelid: Secondary | ICD-10-CM | POA: Diagnosis not present

## 2016-08-18 DIAGNOSIS — F419 Anxiety disorder, unspecified: Secondary | ICD-10-CM | POA: Diagnosis not present

## 2016-10-09 DIAGNOSIS — M170 Bilateral primary osteoarthritis of knee: Secondary | ICD-10-CM | POA: Diagnosis not present

## 2016-10-09 DIAGNOSIS — M25562 Pain in left knee: Secondary | ICD-10-CM | POA: Diagnosis not present

## 2016-10-09 DIAGNOSIS — M25561 Pain in right knee: Secondary | ICD-10-CM | POA: Diagnosis not present

## 2016-10-24 DIAGNOSIS — R262 Difficulty in walking, not elsewhere classified: Secondary | ICD-10-CM | POA: Diagnosis not present

## 2016-10-24 DIAGNOSIS — M170 Bilateral primary osteoarthritis of knee: Secondary | ICD-10-CM | POA: Diagnosis not present

## 2016-10-24 DIAGNOSIS — M25562 Pain in left knee: Secondary | ICD-10-CM | POA: Diagnosis not present

## 2016-10-24 DIAGNOSIS — M25561 Pain in right knee: Secondary | ICD-10-CM | POA: Diagnosis not present

## 2016-11-02 DIAGNOSIS — R262 Difficulty in walking, not elsewhere classified: Secondary | ICD-10-CM | POA: Diagnosis not present

## 2016-11-02 DIAGNOSIS — M170 Bilateral primary osteoarthritis of knee: Secondary | ICD-10-CM | POA: Diagnosis not present

## 2016-11-02 DIAGNOSIS — M25562 Pain in left knee: Secondary | ICD-10-CM | POA: Diagnosis not present

## 2016-11-02 DIAGNOSIS — M1712 Unilateral primary osteoarthritis, left knee: Secondary | ICD-10-CM | POA: Diagnosis not present

## 2016-11-02 DIAGNOSIS — M25561 Pain in right knee: Secondary | ICD-10-CM | POA: Diagnosis not present

## 2016-11-09 DIAGNOSIS — M1711 Unilateral primary osteoarthritis, right knee: Secondary | ICD-10-CM | POA: Diagnosis not present

## 2016-11-09 DIAGNOSIS — M25561 Pain in right knee: Secondary | ICD-10-CM | POA: Diagnosis not present

## 2016-11-13 DIAGNOSIS — M1712 Unilateral primary osteoarthritis, left knee: Secondary | ICD-10-CM | POA: Diagnosis not present

## 2016-11-13 DIAGNOSIS — M25562 Pain in left knee: Secondary | ICD-10-CM | POA: Diagnosis not present

## 2016-11-16 DIAGNOSIS — M25561 Pain in right knee: Secondary | ICD-10-CM | POA: Diagnosis not present

## 2016-11-16 DIAGNOSIS — M1711 Unilateral primary osteoarthritis, right knee: Secondary | ICD-10-CM | POA: Diagnosis not present

## 2016-11-22 DIAGNOSIS — M1712 Unilateral primary osteoarthritis, left knee: Secondary | ICD-10-CM | POA: Diagnosis not present

## 2016-11-22 DIAGNOSIS — M25562 Pain in left knee: Secondary | ICD-10-CM | POA: Diagnosis not present

## 2016-11-23 DIAGNOSIS — M1711 Unilateral primary osteoarthritis, right knee: Secondary | ICD-10-CM | POA: Diagnosis not present

## 2016-11-23 DIAGNOSIS — M25561 Pain in right knee: Secondary | ICD-10-CM | POA: Diagnosis not present

## 2016-11-30 DIAGNOSIS — Z23 Encounter for immunization: Secondary | ICD-10-CM | POA: Diagnosis not present

## 2016-12-14 DIAGNOSIS — M170 Bilateral primary osteoarthritis of knee: Secondary | ICD-10-CM | POA: Diagnosis not present

## 2016-12-14 DIAGNOSIS — M25562 Pain in left knee: Secondary | ICD-10-CM | POA: Diagnosis not present

## 2016-12-14 DIAGNOSIS — M25561 Pain in right knee: Secondary | ICD-10-CM | POA: Diagnosis not present

## 2016-12-18 DIAGNOSIS — B9789 Other viral agents as the cause of diseases classified elsewhere: Secondary | ICD-10-CM | POA: Diagnosis not present

## 2016-12-18 DIAGNOSIS — J069 Acute upper respiratory infection, unspecified: Secondary | ICD-10-CM | POA: Diagnosis not present

## 2016-12-18 DIAGNOSIS — I10 Essential (primary) hypertension: Secondary | ICD-10-CM | POA: Diagnosis not present

## 2017-02-22 DIAGNOSIS — F3341 Major depressive disorder, recurrent, in partial remission: Secondary | ICD-10-CM | POA: Diagnosis not present

## 2017-02-22 DIAGNOSIS — M170 Bilateral primary osteoarthritis of knee: Secondary | ICD-10-CM | POA: Diagnosis not present

## 2017-02-22 DIAGNOSIS — H539 Unspecified visual disturbance: Secondary | ICD-10-CM | POA: Diagnosis not present

## 2017-02-22 DIAGNOSIS — G43909 Migraine, unspecified, not intractable, without status migrainosus: Secondary | ICD-10-CM | POA: Diagnosis not present

## 2017-02-22 DIAGNOSIS — H548 Legal blindness, as defined in USA: Secondary | ICD-10-CM | POA: Diagnosis not present

## 2017-03-19 ENCOUNTER — Institutional Professional Consult (permissible substitution): Payer: Medicare HMO | Admitting: Neurology

## 2017-05-09 DIAGNOSIS — M25562 Pain in left knee: Secondary | ICD-10-CM | POA: Diagnosis not present

## 2017-05-09 DIAGNOSIS — M25561 Pain in right knee: Secondary | ICD-10-CM | POA: Diagnosis not present

## 2017-05-09 DIAGNOSIS — M170 Bilateral primary osteoarthritis of knee: Secondary | ICD-10-CM | POA: Diagnosis not present

## 2017-05-10 ENCOUNTER — Encounter: Payer: Self-pay | Admitting: *Deleted

## 2017-05-11 ENCOUNTER — Encounter (INDEPENDENT_AMBULATORY_CARE_PROVIDER_SITE_OTHER): Payer: Self-pay

## 2017-05-11 ENCOUNTER — Encounter: Payer: Self-pay | Admitting: Neurology

## 2017-05-11 ENCOUNTER — Ambulatory Visit (INDEPENDENT_AMBULATORY_CARE_PROVIDER_SITE_OTHER): Payer: Medicare HMO | Admitting: Neurology

## 2017-05-11 DIAGNOSIS — S161XXA Strain of muscle, fascia and tendon at neck level, initial encounter: Secondary | ICD-10-CM

## 2017-05-11 DIAGNOSIS — G43019 Migraine without aura, intractable, without status migrainosus: Secondary | ICD-10-CM

## 2017-05-11 DIAGNOSIS — S161XXS Strain of muscle, fascia and tendon at neck level, sequela: Secondary | ICD-10-CM

## 2017-05-11 HISTORY — DX: Strain of muscle, fascia and tendon at neck level, initial encounter: S16.1XXA

## 2017-05-11 HISTORY — DX: Migraine without aura, intractable, without status migrainosus: G43.019

## 2017-05-11 MED ORDER — NORTRIPTYLINE HCL 10 MG PO CAPS: 30.0000 mg | ORAL_CAPSULE | Freq: Every day | ORAL | 5 refills | Status: DC

## 2017-05-11 MED ORDER — SUMATRIPTAN SUCCINATE 100 MG PO TABS: 100.0000 mg | ORAL_TABLET | Freq: Two times a day (BID) | ORAL | 5 refills | Status: AC | PRN
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
## 2017-05-11 NOTE — Patient Instructions (Signed)
   We will go up on the nortriptyline to 20 mg at night for 2 weeks, then take 30 mg at night.   Pamelor (nortriptyline) is an antidepressant medication that has many uses that may include headache, whiplash injuries, or for peripheral neuropathy pain. Side effects may include drowsiness, dry mouth, blurred vision, or constipation. As with any antidepressant medication, worsening depression may occur. If you had any significant side effects, please call our office. The full effects of this medication may take 7-10 days after starting the drug, or going up on the dose.

## 2017-06-04 ENCOUNTER — Ambulatory Visit: Payer: Medicare HMO | Admitting: Physical Therapy

## 2017-06-25 DIAGNOSIS — I10 Essential (primary) hypertension: Secondary | ICD-10-CM | POA: Diagnosis not present

## 2017-06-25 DIAGNOSIS — F3341 Major depressive disorder, recurrent, in partial remission: Secondary | ICD-10-CM | POA: Diagnosis not present

## 2017-06-25 DIAGNOSIS — Z113 Encounter for screening for infections with a predominantly sexual mode of transmission: Secondary | ICD-10-CM | POA: Diagnosis not present

## 2017-06-25 DIAGNOSIS — Z23 Encounter for immunization: Secondary | ICD-10-CM | POA: Diagnosis not present

## 2017-08-13 ENCOUNTER — Ambulatory Visit: Payer: Medicare HMO | Admitting: Nurse Practitioner

## 2018-01-11 ENCOUNTER — Ambulatory Visit: Payer: Medicare Other | Admitting: Podiatry

## 2018-01-11 ENCOUNTER — Encounter: Payer: Self-pay | Admitting: Podiatry

## 2018-01-11 ENCOUNTER — Ambulatory Visit (INDEPENDENT_AMBULATORY_CARE_PROVIDER_SITE_OTHER): Payer: Medicare Other

## 2018-01-11 VITALS — BP 146/98 | HR 56 | Resp 16

## 2018-01-11 DIAGNOSIS — M2141 Flat foot [pes planus] (acquired), right foot: Secondary | ICD-10-CM

## 2018-01-11 DIAGNOSIS — M2012 Hallux valgus (acquired), left foot: Secondary | ICD-10-CM | POA: Diagnosis not present

## 2018-01-11 DIAGNOSIS — M25572 Pain in left ankle and joints of left foot: Secondary | ICD-10-CM | POA: Diagnosis not present

## 2018-01-11 DIAGNOSIS — M2142 Flat foot [pes planus] (acquired), left foot: Secondary | ICD-10-CM | POA: Diagnosis not present

## 2018-01-11 NOTE — Patient Instructions (Signed)
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
## 2018-02-08 ENCOUNTER — Ambulatory Visit: Payer: Medicare Other | Admitting: Podiatry

## 2018-11-14 ENCOUNTER — Other Ambulatory Visit: Payer: Self-pay

## 2018-11-14 ENCOUNTER — Emergency Department (HOSPITAL_COMMUNITY): Payer: Medicare Other

## 2018-11-14 ENCOUNTER — Emergency Department (HOSPITAL_COMMUNITY)
Admission: EM | Admit: 2018-11-14 | Discharge: 2018-11-14 | Disposition: A | Discharge status: Home / Self Care | Payer: Medicare Other | Attending: Emergency Medicine | Admitting: Emergency Medicine

## 2018-11-14 ENCOUNTER — Encounter (HOSPITAL_COMMUNITY): Payer: Self-pay | Admitting: Emergency Medicine

## 2018-11-14 DIAGNOSIS — M79601 Pain in right arm: Secondary | ICD-10-CM

## 2018-11-14 DIAGNOSIS — I10 Essential (primary) hypertension: Secondary | ICD-10-CM | POA: Diagnosis not present

## 2018-11-14 DIAGNOSIS — Z79899 Other long term (current) drug therapy: Secondary | ICD-10-CM | POA: Insufficient documentation

## 2018-11-14 DIAGNOSIS — R200 Anesthesia of skin: Secondary | ICD-10-CM | POA: Diagnosis not present

## 2018-11-14 DIAGNOSIS — R110 Nausea: Secondary | ICD-10-CM | POA: Insufficient documentation

## 2018-11-14 DIAGNOSIS — R0789 Other chest pain: Secondary | ICD-10-CM | POA: Insufficient documentation

## 2018-11-14 LAB — BASIC METABOLIC PANEL
Anion gap: 11 (ref 5–15)
BUN: 10 mg/dL (ref 6–20)
CO2: 26 mmol/L (ref 22–32)
Calcium: 9 mg/dL (ref 8.9–10.3)
Chloride: 104 mmol/L (ref 98–111)
Creatinine, Ser: 1.02 mg/dL — ABNORMAL HIGH (ref 0.44–1.00)
GFR calc non Af Amer: 60 mL/min — ABNORMAL LOW (ref >60)
Glucose, Bld: 102 mg/dL — ABNORMAL HIGH (ref 70–99)
Potassium: 3.1 mmol/L — ABNORMAL LOW (ref 3.5–5.1)
Sodium: 141 mmol/L (ref 135–145)

## 2018-11-14 LAB — CBC
HCT: 38.7 % (ref 36.0–46.0)
Hemoglobin: 12.2 g/dL (ref 12.0–15.0)
MCH: 27.5 pg (ref 26.0–34.0)
MCHC: 31.5 g/dL (ref 30.0–36.0)
MCV: 87.4 fL (ref 80.0–100.0)
Platelets: 276 10*3/uL (ref 150–400)
RBC: 4.43 MIL/uL (ref 3.87–5.11)
RDW: 14.3 % (ref 11.5–15.5)
WBC: 5.9 10*3/uL (ref 4.0–10.5)
nRBC: 0 % (ref 0.0–0.2)

## 2018-11-14 LAB — I-STAT TROPONIN, ED
Troponin i, poc: 0.01 ng/mL (ref 0.00–0.08)
Troponin i, poc: 0 ng/mL (ref 0.00–0.08)

## 2018-11-14 MED ORDER — NITROGLYCERIN 0.4 MG SL SUBL
0.4000 mg | SUBLINGUAL_TABLET | SUBLINGUAL | Status: DC | PRN
  Administered 2018-11-14: 0.4 mg via SUBLINGUAL
  Filled 2018-11-14: qty 1

## 2018-11-14 MED ORDER — CYCLOBENZAPRINE HCL 10 MG PO TABS
10.0000 mg | ORAL_TABLET | Freq: Once | ORAL | Status: AC
  Administered 2018-11-14: 10 mg via ORAL
  Filled 2018-11-14: qty 1

## 2018-11-14 MED ORDER — AMLODIPINE BESYLATE 5 MG PO TABS
10.0000 mg | ORAL_TABLET | Freq: Once | ORAL | Status: AC
  Administered 2018-11-14: 10 mg via ORAL
  Filled 2018-11-14: qty 2

## 2018-11-14 MED ORDER — ASPIRIN 81 MG PO CHEW
324.0000 mg | CHEWABLE_TABLET | Freq: Once | ORAL | Status: AC
  Administered 2018-11-14: 324 mg via ORAL
  Filled 2018-11-14: qty 4

## 2018-11-14 MED ORDER — IOPAMIDOL (ISOVUE-370) INJECTION 76%
100.0000 mL | Freq: Once | INTRAVENOUS | Status: AC | PRN
  Administered 2018-11-14: 100 mL via INTRAVENOUS

## 2018-11-14 MED ORDER — ACETAMINOPHEN 500 MG PO TABS: 1000.0000 mg | ORAL_TABLET | Freq: Once | ORAL | Status: DC

## 2018-11-14 MED ORDER — IOPAMIDOL (ISOVUE-370) INJECTION 76%
INTRAVENOUS | Status: AC
  Filled 2018-11-14: qty 100

## 2018-11-14 MED ORDER — KETOROLAC TROMETHAMINE 15 MG/ML IJ SOLN
15.0000 mg | Freq: Once | INTRAMUSCULAR | Status: AC
  Administered 2018-11-14: 15 mg via INTRAVENOUS
  Filled 2018-11-14: qty 1
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
## 2018-11-14 NOTE — ED Triage Notes (Signed)
Patient to ED c/o R arm pain from her shoulder to her fingers upon waking this morning - states it went away for a while but came back and has been constant since but increases and decreases in intensity. Denies injury. Endorses tingling sensation from shoulder to hand as well and pain worsens in shoulder with movement. Pulses equal bilaterally. Denies chest pain, but reports shortness of breath and nausea. Hx HTN - takes all medications as prescribed but forgot last night's dose. [facility] about an hour ago with some relief.

## 2019-02-24 ENCOUNTER — Ambulatory Visit: Payer: Medicare Other | Admitting: Podiatry

## 2019-02-24 ENCOUNTER — Other Ambulatory Visit: Payer: Self-pay

## 2019-02-24 ENCOUNTER — Encounter: Payer: Self-pay | Admitting: Podiatry

## 2019-02-24 VITALS — BP 146/78

## 2019-02-24 DIAGNOSIS — Q828 Other specified congenital malformations of skin: Secondary | ICD-10-CM

## 2019-02-24 DIAGNOSIS — M79674 Pain in right toe(s): Secondary | ICD-10-CM

## 2019-02-24 DIAGNOSIS — B351 Tinea unguium: Secondary | ICD-10-CM

## 2019-02-24 DIAGNOSIS — M79675 Pain in left toe(s): Secondary | ICD-10-CM | POA: Diagnosis not present

## 2019-02-24 MED ORDER — DOXYCYCLINE HYCLATE 100 MG PO CAPS: 100.0000 mg | ORAL_CAPSULE | Freq: Two times a day (BID) | ORAL | 0 refills | Status: AC

## 2019-02-24 NOTE — Patient Instructions (Signed)
Corns and Calluses Corns are small areas of thickened skin that occur on the top, sides, or tip of a toe. They contain a cone-shaped core with a point that can press on a nerve below. This causes pain.  Calluses are areas of thickened skin that can occur anywhere on the body, including the hands, fingers, palms, soles of the feet, and heels. Calluses are usually larger than corns. What are the causes? Corns and calluses are caused by rubbing (friction) or pressure, such as from shoes that are too tight or do not fit properly. What increases the risk? Corns are more likely to develop in people who have misshapen toes (toe deformities), such as hammer toes. Calluses can occur with friction to any area of the skin. They are more likely to develop in people who:  Work with their hands.  Wear shoes that fit poorly, are too tight, or are high-heeled.  Have toe deformities. What are the signs or symptoms? Symptoms of a corn or callus include:  A hard growth on the skin.  Pain or tenderness under the skin.  Redness and swelling.  Increased discomfort while wearing tight-fitting shoes, if your feet are affected. If a corn or callus becomes infected, symptoms may include:  Redness and swelling that gets worse.  Pain.  Fluid, blood, or pus draining from the corn or callus. How is this diagnosed? Corns and calluses may be diagnosed based on your symptoms, your medical history, and a physical exam. How is this treated? Treatment for corns and calluses may include:  Removing the cause of the friction or pressure. This may involve: ? Changing your shoes. ? Wearing shoe inserts (orthotics) or other protective layers in your shoes, such as a corn pad. ? Wearing gloves.  Applying medicine to the skin (topical medicine) to help soften skin in the hardened, thickened areas.  Removing layers of dead skin with a file to reduce the size of the corn or callus.  Removing the corn or callus with a  scalpel or laser.  Taking antibiotic medicines, if your corn or callus is infected.  Having surgery, if a toe deformity is the cause. Follow these instructions at home:   Take over-the-counter and prescription medicines only as told by your health care provider.  If you were prescribed an antibiotic, take it as told by your health care provider. Do not stop taking it even if your condition starts to improve.  Wear shoes that fit well. Avoid wearing high-heeled shoes and shoes that are too tight or too loose.  Wear any padding, protective layers, gloves, or orthotics as told by your health care provider.  Soak your hands or feet and then use a file or pumice stone to soften your corn or callus. Do this as told by your health care provider.  Check your corn or callus every day for symptoms of infection. Contact a health care provider if you:  Notice that your symptoms do not improve with treatment.  Have redness or swelling that gets worse.  Notice that your corn or callus becomes painful.  Have fluid, blood, or pus coming from your corn or callus.  Have new symptoms. Summary  Corns are small areas of thickened skin that occur on the top, sides, or tip of a toe.  Calluses are areas of thickened skin that can occur anywhere on the body, including the hands, fingers, palms, and soles of the feet. Calluses are usually larger than corns.  Corns and calluses are caused by   rubbing (friction) or pressure, such as from shoes that are too tight or do not fit properly.  Treatment may include wearing any padding, protective layers, gloves, or orthotics as told by your health care provider. This information is not intended to replace advice given to you by your health care provider. Make sure you discuss any questions you have with your health care provider. Document Released: 08/26/2004 Document Revised: 10/03/2017 Document Reviewed: 10/03/2017 Elsevier Interactive Patient Education   2019 Elsevier Inc.  Onychomycosis/Fungal Toenails  WHAT IS IT? An infection that lies within the keratin of your nail plate that is caused by a fungus.  WHY ME? Fungal infections affect all ages, sexes, races, and creeds.  There may be many factors that predispose you to a fungal infection such as age, coexisting medical conditions such as diabetes, or an autoimmune disease; stress, medications, fatigue, genetics, etc.  Bottom line: fungus thrives in a warm, moist environment and your shoes offer such a location.  IS IT CONTAGIOUS? Theoretically, yes.  You do not want to share shoes, nail clippers or files with someone who has fungal toenails.  Walking around barefoot in the same room or sleeping in the same bed is unlikely to transfer the organism.  It is important to realize, however, that fungus can spread easily from one nail to the next on the same foot.  HOW DO WE TREAT THIS?  There are several ways to treat this condition.  Treatment may depend on many factors such as age, medications, pregnancy, liver and kidney conditions, etc.  It is best to ask your doctor which options are available to you.  1. No treatment.   Unlike many other medical concerns, you can live with this condition.  However for many people this can be a painful condition and may lead to ingrown toenails or a bacterial infection.  It is recommended that you keep the nails cut short to help reduce the amount of fungal nail. 2. Topical treatment.  These range from herbal remedies to prescription strength nail lacquers.  About 40-50% effective, topicals require twice daily application for approximately 9 to 12 months or until an entirely new nail has grown out.  The most effective topicals are medical grade medications available through physicians offices. 3. Oral antifungal medications.  With an 80-90% cure rate, the most common oral medication requires 3 to 4 months of therapy and stays in your system for a year as the new nail  grows out.  Oral antifungal medications do require blood work to make sure it is a safe drug for you.  A liver function panel will be performed prior to starting the medication and after the first month of treatment.  It is important to have the blood work performed to avoid any harmful side effects.  In general, this medication safe but blood work is required. 4. Laser Therapy.  This treatment is performed by applying a specialized laser to the affected nail plate.  This therapy is noninvasive, fast, and non-painful.  It is not covered by insurance and is therefore, out of pocket.  The results have been very good with a 80-95% cure rate.  The [facility] is the only practice in the area to offer this therapy. 5. Permanent Nail Avulsion.  Removing the entire nail so that a new nail will not grow back. 

## 2019-03-03 ENCOUNTER — Encounter: Payer: Self-pay | Admitting: Podiatry

## 2019-03-03 NOTE — Progress Notes (Signed)
Subjective: Patient presents to clinic with cc of  painful callus left foot which is aggravated when weightbearing with and without shoe gear. This pain limits limits her daily activities. She denies any fever, chills, nightsweats, nausea, vomiting.  She also relates longstanding tender, elongated thick toenails b/l feet which are tender when wearing enclosed shoe gear.  Laurann Montana, MD is her PCP.    Allergies  Allergen Reactions  . Shellfish Allergy Anaphylaxis and Swelling  . Shellfish-Derived Products Anaphylaxis and Swelling  . Oxycodone-Acetaminophen Hives  . Penicillins Hives and Swelling    Has patient had a PCN reaction causing immediate rash, facial/tongue/throat swelling, SOB or lightheadedness with hypotension: Yes Has patient had a PCN reaction causing severe rash involving mucus membranes or skin necrosis: Unk Has patient had a PCN reaction that required hospitalization: No Has patient had a PCN reaction occurring within the last 10 years: No If all of the above answers are "NO", then may proceed with Cephalosporin use.   Marland Kitchen Percocet [Oxycodone-Acetaminophen] Hives  . Zoloft [Sertraline] Nausea Only and Other (See Comments)    "Made me feel very shaky"     Objective:  Physical Examination: Neurovascular status intact b/l feet.  Muscle strength 5/5 to all muscle groups b/l LE  Pes planus b/l  HAV with bunion left foot  Porokeratotic lesion noted subhallux IPJ left foot with exquisite tenderness to palpation. Inflamed with no purulent drainage.  Assessment: Painful porokeratotic lesion subhallux IPJ left foot Onychomycosis 1-5 b/l   Plan: 1. Discussed diagnosis and treatment option of paring lesion for pain relief. Patient unable to tolerate paring without anesthesia. 3 cc of 50/50 mixture of 1% xylocaine plain and 0.5% bupivicaine plain administered via hallux block. Painful porokeratotoc lesion pared without iatrogenic laceration. Dispensed Silipos toe cap  for daily comfort. 2.  Prescription for doxycycline 100 mg po bid x 7 days due to inflammation of lesion left hallux and patient use of medicated corn remover. 3. Toenails 1-5 b/l were debrided in length and girth without iatrogenic laceration. 4. Continue soft, supportive shoe gear daily. 5. Report any pedal injuries to medical professional. 6. Follow up 3 months. 7. Call our office should there be any questions/concerns in the interim.

## 2019-05-26 ENCOUNTER — Ambulatory Visit: Payer: Medicare Other | Admitting: Podiatry

## 2019-09-19 ENCOUNTER — Ambulatory Visit: Payer: Medicare Other | Admitting: Podiatry

## 2019-10-08 ENCOUNTER — Encounter: Payer: Self-pay | Admitting: Podiatry

## 2019-10-08 ENCOUNTER — Ambulatory Visit (INDEPENDENT_AMBULATORY_CARE_PROVIDER_SITE_OTHER): Payer: Medicare Other | Admitting: Podiatry

## 2019-10-08 ENCOUNTER — Other Ambulatory Visit: Payer: Self-pay

## 2019-10-08 DIAGNOSIS — M2012 Hallux valgus (acquired), left foot: Secondary | ICD-10-CM | POA: Diagnosis not present

## 2019-10-08 DIAGNOSIS — Q828 Other specified congenital malformations of skin: Secondary | ICD-10-CM | POA: Diagnosis not present

## 2019-10-08 DIAGNOSIS — M79675 Pain in left toe(s): Secondary | ICD-10-CM

## 2019-10-08 NOTE — Progress Notes (Signed)
This patient presents the office with chief complaint of painful calluses noted one on her big toe left foot and 1 on the inside of her right foot.    She says the callus on the inside of her right foot has become enlarged and raised and causing pain on her big toe left foot is painful walking wearing her shoes.   She says it was so painful that Dr. Adah Perl needed to anesthetize her toe in order to trim.  She says the callus on the bottom of her big toe left foot has become painful and thick today.  She says that when she was initially seen in March by Dr. Adah Perl she needed anesthesia in an order to trim the callus.  She says that it is better today but is still present.  She says she has been sick and has missed appointments but she presents the office today for treatment of her painful callus.  Vascular  Dorsalis pedis and posterior tibial pulses are palpable  B/L.  Capillary return  WNL.  Temperature gradient is  WNL.  Skin turgor  WNL  Sensorium  Senn Weinstein monofilament wire  WNL. Normal tactile sensation.  Nail Exam  Patient has normal nails with no evidence of bacterial or fungal infection.  Orthopedic  Exam  Muscle tone and muscle strength  WNL.  No limitations of motion feet  B/L.  No crepitus or joint effusion noted.  Foot type is unremarkable and digits show no abnormalities.  HAV 1st MPJ left foot.  Pes planus.  Skin  No open lesions.  Normal skin texture and turgor. Callus medial to navicular bone right foot.  Pinch callus noted left hallux due to HAV.  Callus  B/L  Debride callus.  Discussed this condition with this patient.   Told her the pinch callus develops due to her severe bunion formation big toe joint left foot.  Patient states that she is interested in surgical correction of her bunion today.  She is told to make an appointment with one of the podiatric surgeons for the correction of the bunion big toe joint left foot.  [facility] prn for preventative foot care  services.   Gardiner Barefoot DPM

## 2019-10-20 ENCOUNTER — Telehealth: Payer: Self-pay | Admitting: Podiatry

## 2019-10-20 ENCOUNTER — Ambulatory Visit (INDEPENDENT_AMBULATORY_CARE_PROVIDER_SITE_OTHER): Payer: Medicare Other

## 2019-10-20 ENCOUNTER — Encounter: Payer: Self-pay | Admitting: Podiatry

## 2019-10-20 ENCOUNTER — Ambulatory Visit (INDEPENDENT_AMBULATORY_CARE_PROVIDER_SITE_OTHER): Payer: Medicare Other | Admitting: Podiatry

## 2019-10-20 ENCOUNTER — Other Ambulatory Visit: Payer: Self-pay

## 2019-10-20 DIAGNOSIS — M2012 Hallux valgus (acquired), left foot: Secondary | ICD-10-CM

## 2019-10-20 DIAGNOSIS — L84 Corns and callosities: Secondary | ICD-10-CM

## 2019-10-20 DIAGNOSIS — M2011 Hallux valgus (acquired), right foot: Secondary | ICD-10-CM

## 2019-10-20 NOTE — Telephone Encounter (Signed)
DOS: 11/11/2019 SURGICAL PROCEDURES: Krista Santana BSJG([id number]), Aiken Osteotomy [phone]), and Exc. Benign Lesion 3.1-4.0 cm KPTW(65681)  Policy Effective: 27/51/7001 - 12/04/2019  Notification or Prior Authorization is not required for the requested services  This [facility] [employer] members plan does not currently require a prior authorization for these services. If you have general questions about the prior authorization requirements, please call us at [phone] or visit [email or web address] > Clinician Resources > Advance and Admission Notification Requirements. The number above acknowledges your notification. Please write this number down for future reference. Notification is not a guarantee of coverage or payment.  Decision ID #:[id number]  The number above acknowledges your inquiry and our response. Please write this number down and refer to it for future inquiries. Coverage and payment for an item or service is governed by the member's benefit plan document, and, if applicable, the provider's participation agreement with the Health Plan.

## 2019-10-20 NOTE — Patient Instructions (Addendum)
Bunion  A bunion is a bump on the base of the big toe that forms when the bones of the big toe joint move out of position. Bunions may be small at first, but they often get larger over time. They can make walking painful. What are the causes? A bunion may be caused by:  Wearing narrow or pointed shoes that force the big toe to press against the other toes.  Abnormal foot development that causes the foot to roll inward (pronate).  Changes in the foot that are caused by certain diseases, such as rheumatoid arthritis or polio.  A foot injury. What increases the risk? The following factors may make you more likely to develop this condition:  Wearing shoes that squeeze the toes together.  Having certain diseases, such as: ? Rheumatoid arthritis. ? Polio. ? Cerebral palsy.  Having family members who have bunions.  Being born with a foot deformity, such as flat feet or low arches.  Doing activities that put a lot of pressure on the feet, such as ballet dancing. What are the signs or symptoms? The main symptom of a bunion is a noticeable bump on the big toe. Other symptoms may include:  Pain.  Swelling around the big toe.  Redness and inflammation.  Thick or hardened skin on the big toe or between the toes.  Stiffness or loss of motion in the big toe.  Trouble with walking. How is this diagnosed? A bunion may be diagnosed based on your symptoms, medical history, and activities. You may have tests, such as:  X-rays. These allow your health care provider to check the position of the bones in your foot and look for damage to your joint. They also help your health care provider determine the severity of your bunion and the best way to treat it.  Joint aspiration. In this test, a sample of fluid is removed from the toe joint. This test may be done if you are in a lot of pain. It helps rule out diseases that cause painful swelling of the joints, such as arthritis. How is this  treated? Treatment depends on the severity of your symptoms. The goal of treatment is to relieve symptoms and prevent the bunion from getting worse. Your health care provider may recommend:  Wearing shoes that have a wide toe box.  Using bunion pads to cushion the affected area.  Taping your toes together to keep them in a normal position.  Placing a device inside your shoe (orthotics) to help reduce pressure on your toe joint.  Taking medicine to ease pain, inflammation, and swelling.  Applying heat or ice to the affected area.  Doing stretching exercises.  Surgery to remove scar tissue and move the toes back into their normal position. This treatment is rare. Follow these instructions at home: Managing pain, stiffness, and swelling   If directed, put ice on the painful area: ? Put ice in a plastic bag. ? Place a towel between your skin and the bag. ? Leave the ice on for 20 minutes, 2-3 times a day. Activity   If directed, apply heat to the affected area before you exercise. Use the heat source that your health care provider recommends, such as a moist heat pack or a heating pad. ? Place a towel between your skin and the heat source. ? Leave the heat on for 20-30 minutes. ? Remove the heat if your skin turns bright red. This is especially important if you are unable to feel pain,   heat, or cold. You may have a greater risk of getting burned.  Do exercises as told by your health care provider. General instructions  Support your toe joint with proper footwear, shoe padding, or taping as told by your health care provider.  Take over-the-counter and prescription medicines only as told by your health care provider.  Keep all follow-up visits as told by your health care provider. This is important. Contact a health care provider if your symptoms:  Get worse.  Do not improve in 2 weeks. Get help right away if you have:  Severe pain and trouble with walking. Summary  A  bunion is a bump on the base of the big toe that forms when the bones of the big toe joint move out of position.  Bunions can make walking painful.  Treatment depends on the severity of your symptoms.  Support your toe joint with proper footwear, shoe padding, or taping as told by your health care provider. This information is not intended to replace advice given to you by your health care provider. Make sure you discuss any questions you have with your health care provider. Document Released: 11/20/2005 Document Revised: 05/27/2018 Document Reviewed: 04/02/2018 Elsevier Patient Education  2020 Elsevier Inc.  Pre-Operative Instructions  Congratulations, you have decided to take an important step towards improving your quality of life.  You can be assured that the doctors and staff at [facility] will be with you every step of the way.  Here are some important things you should know:  1. Plan to be at the surgery center/hospital at least 1 (one) hour prior to your scheduled time, unless otherwise directed by the surgical center/hospital staff.  You must have a responsible adult accompany you, remain during the surgery and drive you home.  Make sure you have directions to the surgical center/hospital to ensure you arrive on time. 2. If you are having surgery at [facility] or [facility], you will need a copy of your medical history and physical form from your family physician within one month prior to the date of surgery. We will give you a form for your primary physician to complete.  3. We make every effort to accommodate the date you request for surgery.  However, there are times where surgery dates or times have to be moved.  We will contact you as soon as possible if a change in schedule is required.   4. No aspirin/ibuprofen for one week before surgery.  If you are on aspirin, any non-steroidal anti-inflammatory medications (Mobic, Aleve, Ibuprofen) should not be taken seven  (7) days prior to your surgery.  You make take Tylenol for pain prior to surgery.  5. Medications - If you are taking daily heart and blood pressure medications, seizure, reflux, allergy, asthma, anxiety, pain or diabetes medications, make sure you notify the surgery center/hospital before the day of surgery so they can tell you which medications you should take or avoid the day of surgery. 6. No food or drink after midnight the night before surgery unless directed otherwise by surgical center/hospital staff. 7. No alcoholic beverages 24-hours prior to surgery.  No smoking 24-hours prior or 24-hours after surgery. 8. Wear loose pants or shorts. They should be loose enough to fit over bandages, boots, and casts. 9. Don't wear slip-on shoes. Sneakers are preferred. 10. Bring your boot with you to the surgery center/hospital.  Also bring crutches or a walker if your physician has prescribed it for you.    If you do not have this equipment, it will be provided for you after surgery. 11. If you have not been contacted by the surgery center/hospital by the day before your surgery, call to confirm the date and time of your surgery. 12. Leave-time from work may vary depending on the type of surgery you have.  Appropriate arrangements should be made prior to surgery with your employer. 13. Prescriptions will be provided immediately following surgery by your doctor.  Fill these as soon as possible after surgery and take the medication as directed. Pain medications will not be refilled on weekends and must be approved by the doctor. 14. Remove nail polish on the operative foot and avoid getting pedicures prior to surgery. 15. Wash the night before surgery.  The night before surgery wash the foot and leg well with water and the antibacterial soap provided. Be sure to pay special attention to beneath the toenails and in between the toes.  Wash for at least three (3) minutes. Rinse thoroughly with water and dry well with  a towel.  Perform this wash unless told not to do so by your physician.  Enclosed: 1 Ice pack (please put in freezer the night before surgery)   1 Hibiclens skin cleaner   Pre-op instructions  If you have any questions regarding the instructions, please do not hesitate to call our office.  [place]: [address], , [place] [address] -- [phone]  [place]: [address] [place], [place] [address] -- [phone]  [place]: [address]  [place], [place] [address] -- [phone]   Website: [email or web address] 

## 2019-10-20 NOTE — Progress Notes (Signed)
Subjective:   Patient ID: Krista Santana, female   DOB: 63 y.o.   MRN: [id number]   HPI Patient presents stating wants to get surgery done stating that this lesion really hurts her and her bunion hurts on her left foot.  States it is gotten worse over the last couple years and she is tried trimming tried padding tried wider shoes without relief of symptoms   ROS      Objective:  Physical Exam  Neurovascular status found to be intact good digital perfusion noted with patient found to have structural bunion deformity left with redness around the first metatarsal head and is also noted to have moderate frontal plane rotation left hallux with inflammation of the plantar surface medial side.  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  I absolutely explained in exquisite detail that there is no guarantee that this will solve the problem and that ultimately she may have to live with this.  She understands completely and wants surgery as her bunion hurts also and at this point I allowed her to go over consent form reviewing alternative treatments complications associated with surgery.  She is willing to accept risk signed consent form and is scheduled for outpatient surgery at this time understanding total recovery will take 6 months to 1 year and I did dispense air fracture walker with all instructions and to get used to it prior to procedure  X-rays indicate that there is structural bunion deformity left with hallux interphalangeal deformity noted

## 2019-11-05 ENCOUNTER — Telehealth: Payer: Self-pay | Admitting: *Deleted

## 2019-11-05 NOTE — Telephone Encounter (Signed)
"  I'm calling to reschedule my surgery that is scheduled for December 8.  I have the opportunity to make some money.  So, I need the money."  Do you have a date that you would like to reschedule the surgery to?  "I'd like to do it in mid-January."  Dr. Paulla Dolly can do it on December 22, 2018.  "Okay, that date will be fine."  I called Caren Griffins at the surgical center and rescheduled the surgery from 11/11/2019 to 12/23/2019.

## 2019-11-12 ENCOUNTER — Other Ambulatory Visit: Payer: Self-pay | Admitting: Family Medicine

## 2019-11-12 DIAGNOSIS — I77810 Thoracic aortic ectasia: Secondary | ICD-10-CM

## 2019-11-20 ENCOUNTER — Encounter: Payer: Medicare Other | Admitting: Podiatry

## 2019-11-20 ENCOUNTER — Ambulatory Visit
Admission: RE | Admit: 2019-11-20 | Discharge: 2019-11-20 | Disposition: A | Discharge status: Home / Self Care | Payer: Medicare Other | Source: Ambulatory Visit | Attending: Family Medicine | Admitting: Family Medicine

## 2019-11-20 ENCOUNTER — Other Ambulatory Visit: Payer: Self-pay

## 2019-11-20 DIAGNOSIS — I77810 Thoracic aortic ectasia: Secondary | ICD-10-CM

## 2019-11-20 MED ORDER — IOPAMIDOL (ISOVUE-370) INJECTION 76%
75.0000 mL | Freq: Once | INTRAVENOUS | Status: AC | PRN
  Administered 2019-11-20: 75 mL via INTRAVENOUS

## 2019-12-04 ENCOUNTER — Encounter: Payer: Medicare Other | Admitting: Podiatry

## 2019-12-09 ENCOUNTER — Telehealth: Payer: Self-pay | Admitting: Podiatry

## 2019-12-09 NOTE — Telephone Encounter (Signed)
Called pt to verify new insurance of [employer]. Pt stated that was correct. Pt stated she has some family things going on and that she needs to cancel her surgery for now. Stated she will call me when she is able to reschedule. I gave the pt my direct number to call me so I could get her rescheduled when she was able.   I have cancelled pt's sx on Dr. Beverlee Nims schedule in both the surgery book and his schedule in Epic. I've cancelled her postop visits and I've also contacted the surgical center to cancel the surgery with them.

## 2019-12-18 ENCOUNTER — Telehealth: Payer: Self-pay | Admitting: Podiatry

## 2019-12-18 DIAGNOSIS — R69 Illness, unspecified: Secondary | ICD-10-CM | POA: Diagnosis not present

## 2019-12-18 DIAGNOSIS — M8580 Other specified disorders of bone density and structure, unspecified site: Secondary | ICD-10-CM | POA: Diagnosis not present

## 2019-12-18 DIAGNOSIS — J4521 Mild intermittent asthma with (acute) exacerbation: Secondary | ICD-10-CM | POA: Diagnosis not present

## 2019-12-18 DIAGNOSIS — M170 Bilateral primary osteoarthritis of knee: Secondary | ICD-10-CM | POA: Diagnosis not present

## 2019-12-18 DIAGNOSIS — E785 Hyperlipidemia, unspecified: Secondary | ICD-10-CM | POA: Diagnosis not present

## 2019-12-18 DIAGNOSIS — M179 Osteoarthritis of knee, unspecified: Secondary | ICD-10-CM | POA: Diagnosis not present

## 2019-12-18 DIAGNOSIS — E7800 Pure hypercholesterolemia, unspecified: Secondary | ICD-10-CM | POA: Diagnosis not present

## 2019-12-18 NOTE — Telephone Encounter (Signed)
Called and confirmed with Krista Santana that pt had cancelled her sx for 01/19.

## 2019-12-18 NOTE — Telephone Encounter (Signed)
I had a message from [place], pre-op nurse saying that pt cancelled sx for 01/19 with Dr. Charlsie Merles. I just wanted to confirm that was still accurate. Thanks.

## 2019-12-22 DIAGNOSIS — H903 Sensorineural hearing loss, bilateral: Secondary | ICD-10-CM | POA: Diagnosis not present

## 2019-12-29 ENCOUNTER — Encounter: Payer: Medicare HMO | Admitting: Podiatry

## 2020-01-03 DIAGNOSIS — H903 Sensorineural hearing loss, bilateral: Secondary | ICD-10-CM | POA: Diagnosis not present

## 2020-01-04 DIAGNOSIS — R69 Illness, unspecified: Secondary | ICD-10-CM | POA: Diagnosis not present

## 2020-01-04 DIAGNOSIS — M8580 Other specified disorders of bone density and structure, unspecified site: Secondary | ICD-10-CM | POA: Diagnosis not present

## 2020-01-04 DIAGNOSIS — E785 Hyperlipidemia, unspecified: Secondary | ICD-10-CM | POA: Diagnosis not present

## 2020-01-04 DIAGNOSIS — J4521 Mild intermittent asthma with (acute) exacerbation: Secondary | ICD-10-CM | POA: Diagnosis not present

## 2020-01-04 DIAGNOSIS — E7800 Pure hypercholesterolemia, unspecified: Secondary | ICD-10-CM | POA: Diagnosis not present

## 2020-01-04 DIAGNOSIS — M170 Bilateral primary osteoarthritis of knee: Secondary | ICD-10-CM | POA: Diagnosis not present

## 2020-01-04 DIAGNOSIS — M179 Osteoarthritis of knee, unspecified: Secondary | ICD-10-CM | POA: Diagnosis not present

## 2020-01-05 ENCOUNTER — Encounter: Payer: Medicare Other | Admitting: Podiatry

## 2020-01-12 ENCOUNTER — Encounter: Payer: Medicare Other | Admitting: Podiatry

## 2020-01-20 DIAGNOSIS — E7800 Pure hypercholesterolemia, unspecified: Secondary | ICD-10-CM | POA: Diagnosis not present

## 2020-01-20 DIAGNOSIS — M179 Osteoarthritis of knee, unspecified: Secondary | ICD-10-CM | POA: Diagnosis not present

## 2020-01-20 DIAGNOSIS — E785 Hyperlipidemia, unspecified: Secondary | ICD-10-CM | POA: Diagnosis not present

## 2020-01-20 DIAGNOSIS — M8580 Other specified disorders of bone density and structure, unspecified site: Secondary | ICD-10-CM | POA: Diagnosis not present

## 2020-01-20 DIAGNOSIS — M170 Bilateral primary osteoarthritis of knee: Secondary | ICD-10-CM | POA: Diagnosis not present

## 2020-01-20 DIAGNOSIS — R69 Illness, unspecified: Secondary | ICD-10-CM | POA: Diagnosis not present

## 2020-01-20 DIAGNOSIS — J4521 Mild intermittent asthma with (acute) exacerbation: Secondary | ICD-10-CM | POA: Diagnosis not present

## 2020-02-01 DIAGNOSIS — R69 Illness, unspecified: Secondary | ICD-10-CM | POA: Diagnosis not present

## 2020-02-01 DIAGNOSIS — J4521 Mild intermittent asthma with (acute) exacerbation: Secondary | ICD-10-CM | POA: Diagnosis not present

## 2020-02-01 DIAGNOSIS — E785 Hyperlipidemia, unspecified: Secondary | ICD-10-CM | POA: Diagnosis not present

## 2020-02-01 DIAGNOSIS — M8580 Other specified disorders of bone density and structure, unspecified site: Secondary | ICD-10-CM | POA: Diagnosis not present

## 2020-02-01 DIAGNOSIS — M179 Osteoarthritis of knee, unspecified: Secondary | ICD-10-CM | POA: Diagnosis not present

## 2020-02-01 DIAGNOSIS — E7800 Pure hypercholesterolemia, unspecified: Secondary | ICD-10-CM | POA: Diagnosis not present

## 2020-02-01 DIAGNOSIS — M170 Bilateral primary osteoarthritis of knee: Secondary | ICD-10-CM | POA: Diagnosis not present

## 2020-02-02 DIAGNOSIS — I10 Essential (primary) hypertension: Secondary | ICD-10-CM | POA: Diagnosis not present

## 2020-02-02 DIAGNOSIS — Z7722 Contact with and (suspected) exposure to environmental tobacco smoke (acute) (chronic): Secondary | ICD-10-CM | POA: Diagnosis not present

## 2020-02-02 DIAGNOSIS — Z833 Family history of diabetes mellitus: Secondary | ICD-10-CM | POA: Diagnosis not present

## 2020-02-02 DIAGNOSIS — Z91013 Allergy to seafood: Secondary | ICD-10-CM | POA: Diagnosis not present

## 2020-02-02 DIAGNOSIS — Z885 Allergy status to narcotic agent status: Secondary | ICD-10-CM | POA: Diagnosis not present

## 2020-02-02 DIAGNOSIS — B009 Herpesviral infection, unspecified: Secondary | ICD-10-CM | POA: Diagnosis not present

## 2020-02-02 DIAGNOSIS — Z880 Allergy status to penicillin: Secondary | ICD-10-CM | POA: Diagnosis not present

## 2020-02-02 DIAGNOSIS — Z8249 Family history of ischemic heart disease and other diseases of the circulatory system: Secondary | ICD-10-CM | POA: Diagnosis not present

## 2020-02-02 DIAGNOSIS — Z809 Family history of malignant neoplasm, unspecified: Secondary | ICD-10-CM | POA: Diagnosis not present

## 2020-02-19 DIAGNOSIS — J4521 Mild intermittent asthma with (acute) exacerbation: Secondary | ICD-10-CM | POA: Diagnosis not present

## 2020-02-19 DIAGNOSIS — M8580 Other specified disorders of bone density and structure, unspecified site: Secondary | ICD-10-CM | POA: Diagnosis not present

## 2020-02-19 DIAGNOSIS — M179 Osteoarthritis of knee, unspecified: Secondary | ICD-10-CM | POA: Diagnosis not present

## 2020-02-19 DIAGNOSIS — E7800 Pure hypercholesterolemia, unspecified: Secondary | ICD-10-CM | POA: Diagnosis not present

## 2020-02-19 DIAGNOSIS — G4700 Insomnia, unspecified: Secondary | ICD-10-CM | POA: Diagnosis not present

## 2020-02-19 DIAGNOSIS — M170 Bilateral primary osteoarthritis of knee: Secondary | ICD-10-CM | POA: Diagnosis not present

## 2020-02-19 DIAGNOSIS — E785 Hyperlipidemia, unspecified: Secondary | ICD-10-CM | POA: Diagnosis not present

## 2020-02-19 DIAGNOSIS — R69 Illness, unspecified: Secondary | ICD-10-CM | POA: Diagnosis not present

## 2020-02-23 ENCOUNTER — Ambulatory Visit
Admission: RE | Admit: 2020-02-23 | Discharge: 2020-02-23 | Disposition: A | Discharge status: Home / Self Care | Payer: Medicare HMO | Source: Ambulatory Visit | Attending: Family Medicine | Admitting: Family Medicine

## 2020-02-23 ENCOUNTER — Other Ambulatory Visit: Payer: Self-pay

## 2020-02-23 ENCOUNTER — Other Ambulatory Visit: Payer: Self-pay | Admitting: Family Medicine

## 2020-02-23 DIAGNOSIS — S60212A Contusion of left wrist, initial encounter: Secondary | ICD-10-CM

## 2020-02-23 DIAGNOSIS — M25532 Pain in left wrist: Secondary | ICD-10-CM | POA: Diagnosis not present

## 2020-02-23 DIAGNOSIS — R69 Illness, unspecified: Secondary | ICD-10-CM | POA: Diagnosis not present

## 2020-02-23 DIAGNOSIS — G44209 Tension-type headache, unspecified, not intractable: Secondary | ICD-10-CM | POA: Diagnosis not present

## 2020-02-23 DIAGNOSIS — M7989 Other specified soft tissue disorders: Secondary | ICD-10-CM | POA: Diagnosis not present

## 2020-02-23 DIAGNOSIS — S6992XA Unspecified injury of left wrist, hand and finger(s), initial encounter: Secondary | ICD-10-CM | POA: Diagnosis not present

## 2020-02-23 DIAGNOSIS — I10 Essential (primary) hypertension: Secondary | ICD-10-CM | POA: Diagnosis not present

## 2020-03-01 DIAGNOSIS — Z1231 Encounter for screening mammogram for malignant neoplasm of breast: Secondary | ICD-10-CM | POA: Diagnosis not present

## 2020-03-01 DIAGNOSIS — S60212A Contusion of left wrist, initial encounter: Secondary | ICD-10-CM | POA: Diagnosis not present

## 2020-03-03 DIAGNOSIS — E785 Hyperlipidemia, unspecified: Secondary | ICD-10-CM | POA: Diagnosis not present

## 2020-03-03 DIAGNOSIS — G4700 Insomnia, unspecified: Secondary | ICD-10-CM | POA: Diagnosis not present

## 2020-03-03 DIAGNOSIS — M8580 Other specified disorders of bone density and structure, unspecified site: Secondary | ICD-10-CM | POA: Diagnosis not present

## 2020-03-03 DIAGNOSIS — E7800 Pure hypercholesterolemia, unspecified: Secondary | ICD-10-CM | POA: Diagnosis not present

## 2020-03-03 DIAGNOSIS — M170 Bilateral primary osteoarthritis of knee: Secondary | ICD-10-CM | POA: Diagnosis not present

## 2020-03-03 DIAGNOSIS — M179 Osteoarthritis of knee, unspecified: Secondary | ICD-10-CM | POA: Diagnosis not present

## 2020-03-03 DIAGNOSIS — J4521 Mild intermittent asthma with (acute) exacerbation: Secondary | ICD-10-CM | POA: Diagnosis not present

## 2020-03-03 DIAGNOSIS — R69 Illness, unspecified: Secondary | ICD-10-CM | POA: Diagnosis not present

## 2020-03-17 ENCOUNTER — Ambulatory Visit: Payer: Medicare HMO | Attending: Internal Medicine

## 2020-03-17 DIAGNOSIS — Z20822 Contact with and (suspected) exposure to covid-19: Secondary | ICD-10-CM | POA: Diagnosis not present

## 2020-03-18 LAB — SARS-COV-2, NAA 2 DAY TAT

## 2020-03-18 LAB — NOVEL CORONAVIRUS, NAA: SARS-CoV-2, NAA: NOT DETECTED

## 2020-03-22 DIAGNOSIS — Z20822 Contact with and (suspected) exposure to covid-19: Secondary | ICD-10-CM | POA: Diagnosis not present

## 2020-03-25 DIAGNOSIS — I10 Essential (primary) hypertension: Secondary | ICD-10-CM | POA: Diagnosis not present

## 2020-03-25 DIAGNOSIS — R69 Illness, unspecified: Secondary | ICD-10-CM | POA: Diagnosis not present

## 2020-04-22 DIAGNOSIS — E785 Hyperlipidemia, unspecified: Secondary | ICD-10-CM | POA: Diagnosis not present

## 2020-04-22 DIAGNOSIS — I10 Essential (primary) hypertension: Secondary | ICD-10-CM | POA: Diagnosis not present

## 2020-04-22 DIAGNOSIS — M170 Bilateral primary osteoarthritis of knee: Secondary | ICD-10-CM | POA: Diagnosis not present

## 2020-04-22 DIAGNOSIS — J4521 Mild intermittent asthma with (acute) exacerbation: Secondary | ICD-10-CM | POA: Diagnosis not present

## 2020-04-22 DIAGNOSIS — G4700 Insomnia, unspecified: Secondary | ICD-10-CM | POA: Diagnosis not present

## 2020-04-22 DIAGNOSIS — R69 Illness, unspecified: Secondary | ICD-10-CM | POA: Diagnosis not present

## 2020-05-24 DIAGNOSIS — G4700 Insomnia, unspecified: Secondary | ICD-10-CM | POA: Diagnosis not present

## 2020-05-24 DIAGNOSIS — I10 Essential (primary) hypertension: Secondary | ICD-10-CM | POA: Diagnosis not present

## 2020-05-24 DIAGNOSIS — R69 Illness, unspecified: Secondary | ICD-10-CM | POA: Diagnosis not present

## 2020-05-24 DIAGNOSIS — J4521 Mild intermittent asthma with (acute) exacerbation: Secondary | ICD-10-CM | POA: Diagnosis not present

## 2020-05-24 DIAGNOSIS — M170 Bilateral primary osteoarthritis of knee: Secondary | ICD-10-CM | POA: Diagnosis not present

## 2020-05-24 DIAGNOSIS — E785 Hyperlipidemia, unspecified: Secondary | ICD-10-CM | POA: Diagnosis not present

## 2020-07-02 DIAGNOSIS — M170 Bilateral primary osteoarthritis of knee: Secondary | ICD-10-CM | POA: Diagnosis not present

## 2020-07-02 DIAGNOSIS — I10 Essential (primary) hypertension: Secondary | ICD-10-CM | POA: Diagnosis not present

## 2020-07-02 DIAGNOSIS — J4521 Mild intermittent asthma with (acute) exacerbation: Secondary | ICD-10-CM | POA: Diagnosis not present

## 2020-07-02 DIAGNOSIS — R69 Illness, unspecified: Secondary | ICD-10-CM | POA: Diagnosis not present

## 2020-07-02 DIAGNOSIS — E785 Hyperlipidemia, unspecified: Secondary | ICD-10-CM | POA: Diagnosis not present

## 2020-07-02 DIAGNOSIS — G4700 Insomnia, unspecified: Secondary | ICD-10-CM | POA: Diagnosis not present

## 2020-07-27 DIAGNOSIS — M170 Bilateral primary osteoarthritis of knee: Secondary | ICD-10-CM | POA: Diagnosis not present

## 2020-07-27 DIAGNOSIS — E785 Hyperlipidemia, unspecified: Secondary | ICD-10-CM | POA: Diagnosis not present

## 2020-07-27 DIAGNOSIS — I10 Essential (primary) hypertension: Secondary | ICD-10-CM | POA: Diagnosis not present

## 2020-07-27 DIAGNOSIS — J4521 Mild intermittent asthma with (acute) exacerbation: Secondary | ICD-10-CM | POA: Diagnosis not present

## 2020-07-27 DIAGNOSIS — R69 Illness, unspecified: Secondary | ICD-10-CM | POA: Diagnosis not present

## 2020-07-27 DIAGNOSIS — G4700 Insomnia, unspecified: Secondary | ICD-10-CM | POA: Diagnosis not present

## 2021-01-13 ENCOUNTER — Other Ambulatory Visit: Payer: Self-pay | Admitting: Family Medicine

## 2021-01-13 DIAGNOSIS — I77810 Thoracic aortic ectasia: Secondary | ICD-10-CM

## 2021-02-04 ENCOUNTER — Ambulatory Visit
Admission: RE | Admit: 2021-02-04 | Discharge: 2021-02-04 | Disposition: A | Discharge status: Home / Self Care | Payer: Medicare Other | Source: Ambulatory Visit | Attending: Family Medicine | Admitting: Family Medicine

## 2021-02-04 DIAGNOSIS — I712 Thoracic aortic aneurysm, without rupture: Secondary | ICD-10-CM | POA: Diagnosis not present

## 2021-02-04 DIAGNOSIS — E278 Other specified disorders of adrenal gland: Secondary | ICD-10-CM | POA: Diagnosis not present

## 2021-02-04 DIAGNOSIS — I77810 Thoracic aortic ectasia: Secondary | ICD-10-CM

## 2021-02-04 MED ORDER — IOPAMIDOL (ISOVUE-370) INJECTION 76%
75.0000 mL | Freq: Once | INTRAVENOUS | Status: AC | PRN
  Administered 2021-02-04: 75 mL via INTRAVENOUS

## 2021-02-15 DIAGNOSIS — R42 Dizziness and giddiness: Secondary | ICD-10-CM | POA: Diagnosis not present

## 2021-02-15 DIAGNOSIS — Z7722 Contact with and (suspected) exposure to environmental tobacco smoke (acute) (chronic): Secondary | ICD-10-CM | POA: Diagnosis not present

## 2021-02-15 DIAGNOSIS — L988 Other specified disorders of the skin and subcutaneous tissue: Secondary | ICD-10-CM | POA: Diagnosis not present

## 2021-02-15 DIAGNOSIS — H903 Sensorineural hearing loss, bilateral: Secondary | ICD-10-CM | POA: Diagnosis not present

## 2021-02-15 DIAGNOSIS — L299 Pruritus, unspecified: Secondary | ICD-10-CM | POA: Diagnosis not present

## 2021-02-15 DIAGNOSIS — Z974 Presence of external hearing-aid: Secondary | ICD-10-CM | POA: Diagnosis not present

## 2021-02-15 DIAGNOSIS — H90A32 Mixed conductive and sensorineural hearing loss, unilateral, left ear with restricted hearing on the contralateral side: Secondary | ICD-10-CM | POA: Diagnosis not present

## 2021-03-07 DIAGNOSIS — Z1231 Encounter for screening mammogram for malignant neoplasm of breast: Secondary | ICD-10-CM | POA: Diagnosis not present

## 2021-05-23 DIAGNOSIS — I10 Essential (primary) hypertension: Secondary | ICD-10-CM | POA: Diagnosis not present

## 2021-05-23 DIAGNOSIS — J4530 Mild persistent asthma, uncomplicated: Secondary | ICD-10-CM | POA: Diagnosis not present

## 2021-06-01 DIAGNOSIS — G4700 Insomnia, unspecified: Secondary | ICD-10-CM | POA: Diagnosis not present

## 2021-06-01 DIAGNOSIS — E785 Hyperlipidemia, unspecified: Secondary | ICD-10-CM | POA: Diagnosis not present

## 2021-06-01 DIAGNOSIS — J4530 Mild persistent asthma, uncomplicated: Secondary | ICD-10-CM | POA: Diagnosis not present

## 2021-06-01 DIAGNOSIS — I10 Essential (primary) hypertension: Secondary | ICD-10-CM | POA: Diagnosis not present

## 2021-06-01 DIAGNOSIS — M170 Bilateral primary osteoarthritis of knee: Secondary | ICD-10-CM | POA: Diagnosis not present

## 2021-06-01 DIAGNOSIS — J4521 Mild intermittent asthma with (acute) exacerbation: Secondary | ICD-10-CM | POA: Diagnosis not present

## 2021-06-01 DIAGNOSIS — K219 Gastro-esophageal reflux disease without esophagitis: Secondary | ICD-10-CM | POA: Diagnosis not present

## 2021-06-16 DIAGNOSIS — R0602 Shortness of breath: Secondary | ICD-10-CM | POA: Diagnosis not present

## 2021-06-16 DIAGNOSIS — I700 Atherosclerosis of aorta: Secondary | ICD-10-CM | POA: Diagnosis not present

## 2021-06-16 DIAGNOSIS — R079 Chest pain, unspecified: Secondary | ICD-10-CM | POA: Diagnosis not present

## 2021-06-16 DIAGNOSIS — R001 Bradycardia, unspecified: Secondary | ICD-10-CM | POA: Diagnosis not present

## 2021-06-16 DIAGNOSIS — I517 Cardiomegaly: Secondary | ICD-10-CM | POA: Diagnosis not present

## 2021-06-16 DIAGNOSIS — Z5321 Procedure and treatment not carried out due to patient leaving prior to being seen by health care provider: Secondary | ICD-10-CM | POA: Diagnosis not present

## 2021-06-28 DIAGNOSIS — M170 Bilateral primary osteoarthritis of knee: Secondary | ICD-10-CM | POA: Diagnosis not present

## 2021-06-28 DIAGNOSIS — G4700 Insomnia, unspecified: Secondary | ICD-10-CM | POA: Diagnosis not present

## 2021-06-28 DIAGNOSIS — E785 Hyperlipidemia, unspecified: Secondary | ICD-10-CM | POA: Diagnosis not present

## 2021-06-28 DIAGNOSIS — J4521 Mild intermittent asthma with (acute) exacerbation: Secondary | ICD-10-CM | POA: Diagnosis not present

## 2021-06-28 DIAGNOSIS — I10 Essential (primary) hypertension: Secondary | ICD-10-CM | POA: Diagnosis not present

## 2021-06-28 DIAGNOSIS — K219 Gastro-esophageal reflux disease without esophagitis: Secondary | ICD-10-CM | POA: Diagnosis not present

## 2021-06-28 DIAGNOSIS — J4530 Mild persistent asthma, uncomplicated: Secondary | ICD-10-CM | POA: Diagnosis not present

## 2021-07-11 DIAGNOSIS — J4530 Mild persistent asthma, uncomplicated: Secondary | ICD-10-CM | POA: Diagnosis not present

## 2021-07-11 DIAGNOSIS — R490 Dysphonia: Secondary | ICD-10-CM | POA: Diagnosis not present

## 2021-07-11 DIAGNOSIS — I10 Essential (primary) hypertension: Secondary | ICD-10-CM | POA: Diagnosis not present

## 2021-07-26 DIAGNOSIS — E785 Hyperlipidemia, unspecified: Secondary | ICD-10-CM | POA: Diagnosis not present

## 2021-07-26 DIAGNOSIS — J4530 Mild persistent asthma, uncomplicated: Secondary | ICD-10-CM | POA: Diagnosis not present

## 2021-07-26 DIAGNOSIS — I10 Essential (primary) hypertension: Secondary | ICD-10-CM | POA: Diagnosis not present

## 2021-07-26 DIAGNOSIS — G4700 Insomnia, unspecified: Secondary | ICD-10-CM | POA: Diagnosis not present

## 2021-07-26 DIAGNOSIS — M170 Bilateral primary osteoarthritis of knee: Secondary | ICD-10-CM | POA: Diagnosis not present

## 2021-07-26 DIAGNOSIS — K219 Gastro-esophageal reflux disease without esophagitis: Secondary | ICD-10-CM | POA: Diagnosis not present

## 2021-07-26 DIAGNOSIS — J4521 Mild intermittent asthma with (acute) exacerbation: Secondary | ICD-10-CM | POA: Diagnosis not present

## 2021-07-27 DIAGNOSIS — I10 Essential (primary) hypertension: Secondary | ICD-10-CM | POA: Diagnosis not present

## 2021-07-27 DIAGNOSIS — H9192 Unspecified hearing loss, left ear: Secondary | ICD-10-CM | POA: Diagnosis not present

## 2021-07-27 DIAGNOSIS — R413 Other amnesia: Secondary | ICD-10-CM | POA: Diagnosis not present

## 2021-08-19 DIAGNOSIS — J4530 Mild persistent asthma, uncomplicated: Secondary | ICD-10-CM | POA: Diagnosis not present

## 2021-08-19 DIAGNOSIS — J4521 Mild intermittent asthma with (acute) exacerbation: Secondary | ICD-10-CM | POA: Diagnosis not present

## 2021-08-19 DIAGNOSIS — E785 Hyperlipidemia, unspecified: Secondary | ICD-10-CM | POA: Diagnosis not present

## 2021-08-19 DIAGNOSIS — R509 Fever, unspecified: Secondary | ICD-10-CM | POA: Diagnosis not present

## 2021-08-19 DIAGNOSIS — K219 Gastro-esophageal reflux disease without esophagitis: Secondary | ICD-10-CM | POA: Diagnosis not present

## 2021-08-19 DIAGNOSIS — I10 Essential (primary) hypertension: Secondary | ICD-10-CM | POA: Diagnosis not present

## 2021-08-19 DIAGNOSIS — R52 Pain, unspecified: Secondary | ICD-10-CM | POA: Diagnosis not present

## 2021-08-19 DIAGNOSIS — Z20822 Contact with and (suspected) exposure to covid-19: Secondary | ICD-10-CM | POA: Diagnosis not present

## 2021-08-19 DIAGNOSIS — J069 Acute upper respiratory infection, unspecified: Secondary | ICD-10-CM | POA: Diagnosis not present

## 2021-08-19 DIAGNOSIS — U071 COVID-19: Secondary | ICD-10-CM | POA: Diagnosis not present

## 2021-08-19 DIAGNOSIS — G4700 Insomnia, unspecified: Secondary | ICD-10-CM | POA: Diagnosis not present

## 2021-08-19 DIAGNOSIS — R6883 Chills (without fever): Secondary | ICD-10-CM | POA: Diagnosis not present

## 2021-08-22 DIAGNOSIS — U071 COVID-19: Secondary | ICD-10-CM | POA: Diagnosis not present

## 2021-08-22 DIAGNOSIS — E785 Hyperlipidemia, unspecified: Secondary | ICD-10-CM | POA: Diagnosis not present

## 2021-08-22 DIAGNOSIS — I10 Essential (primary) hypertension: Secondary | ICD-10-CM | POA: Diagnosis not present

## 2021-09-22 ENCOUNTER — Ambulatory Visit: Payer: Medicare Other | Admitting: Neurology

## 2021-09-22 ENCOUNTER — Encounter: Payer: Self-pay | Admitting: Neurology

## 2021-10-18 ENCOUNTER — Ambulatory Visit: Payer: Medicare Other | Admitting: Neurology

## 2021-11-01 DIAGNOSIS — B351 Tinea unguium: Secondary | ICD-10-CM | POA: Diagnosis not present

## 2021-11-01 DIAGNOSIS — L84 Corns and callosities: Secondary | ICD-10-CM | POA: Diagnosis not present

## 2021-11-01 DIAGNOSIS — L603 Nail dystrophy: Secondary | ICD-10-CM | POA: Diagnosis not present

## 2021-11-01 DIAGNOSIS — L600 Ingrowing nail: Secondary | ICD-10-CM | POA: Diagnosis not present

## 2021-11-09 ENCOUNTER — Institutional Professional Consult (permissible substitution): Payer: Medicare Other | Admitting: Neurology

## 2021-11-24 IMAGING — CT CT ANGIO CHEST
1 series · 19 of 32 positions shown · IV contrast (APPLIED)
Comparison: CTA chest 11/20/2019; 11/14/2018

CLINICAL DATA: 65-year-old female with a history of thoracic aortic
aneurysm

EXAM:
CT ANGIOGRAPHY CHEST WITH CONTRAST
TECHNIQUE: Multidetector CT imaging of the chest was performed using the
standard protocol during bolus administration of intravenous
contrast. Multiplanar CT image reconstructions and MIPs were
obtained to evaluate the vascular anatomy.
CONTRAST:  75mL U8OHZ5-XNY IOPAMIDOL (U8OHZ5-XNY) INJECTION 76%

[Series 4: chest angio · axial · 0.72mm/px · z∈[-263,+28]mm · 19 of 105 slices shown]
[im 4/105  lung]
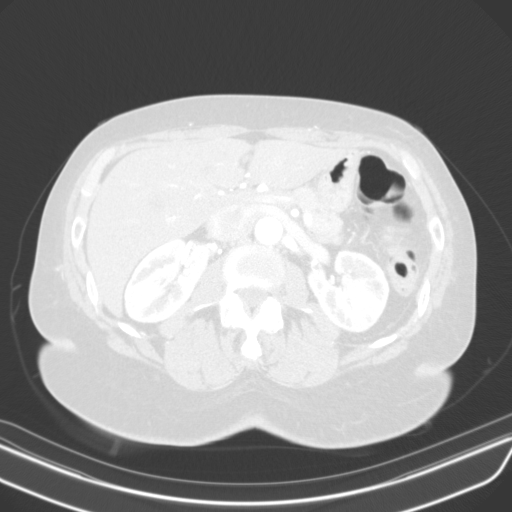
[im 11/105  soft-tissue]
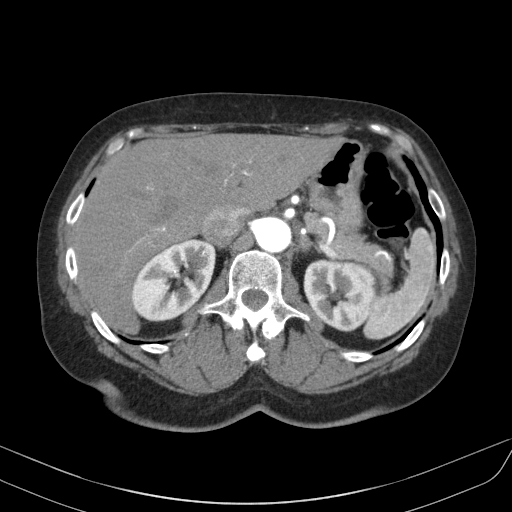
[im 14/105  lung]
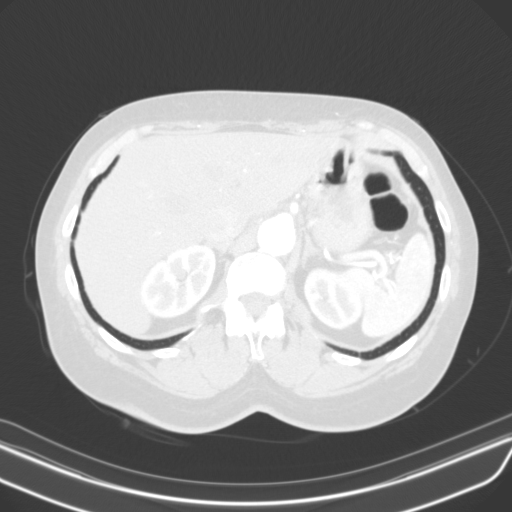
[im 21/105  soft-tissue]
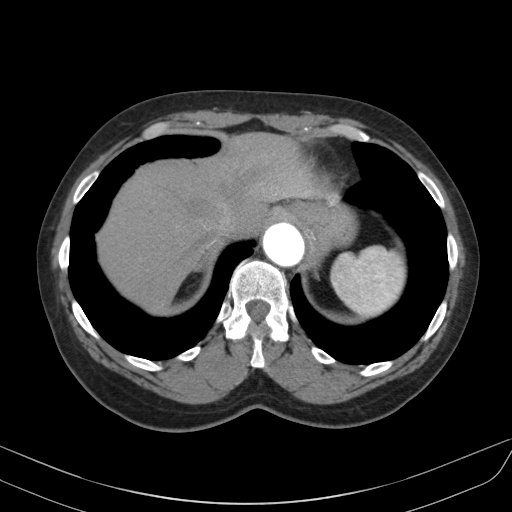
[im 27/105  lung]
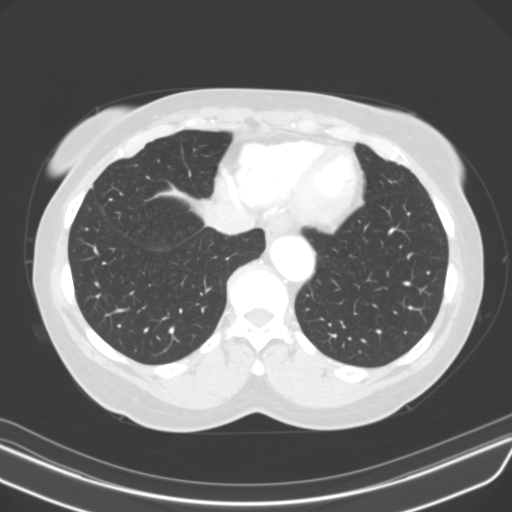
[im 31/105  soft-tissue]
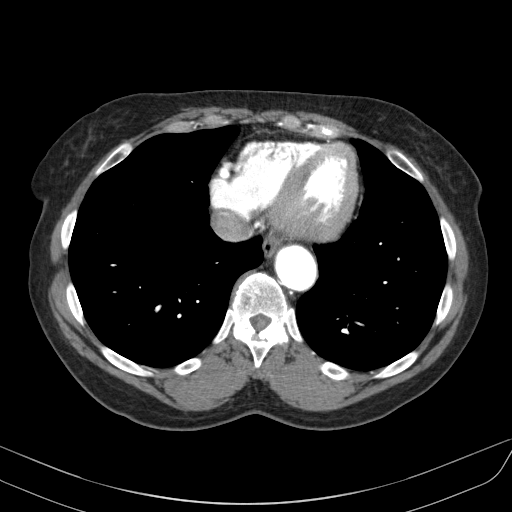
[im 37/105  lung]
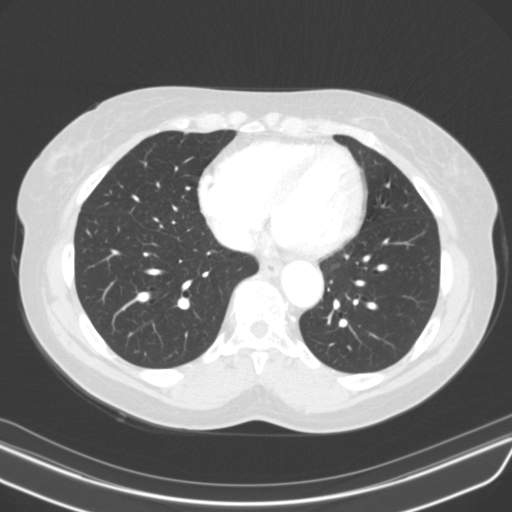
[im 41/105  soft-tissue]
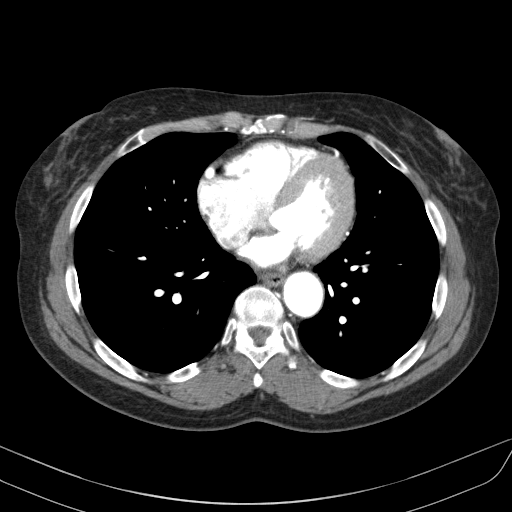
[im 47/105  lung]
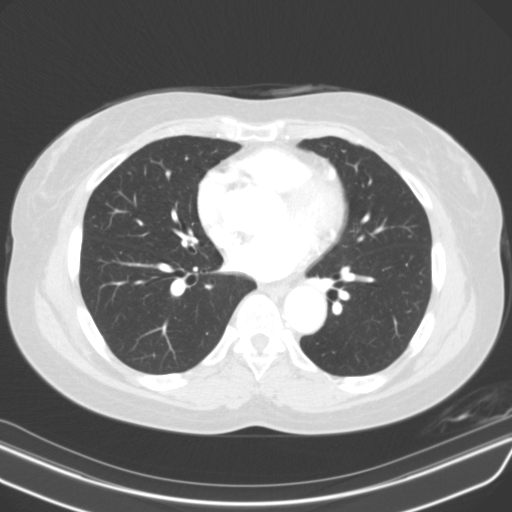
[im 54/105  soft-tissue]
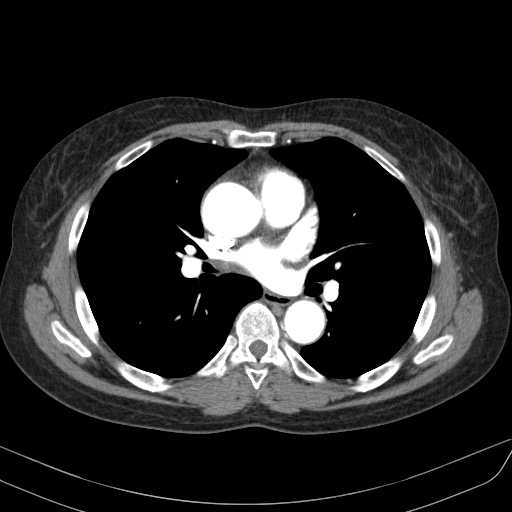
[im 58/105  lung]
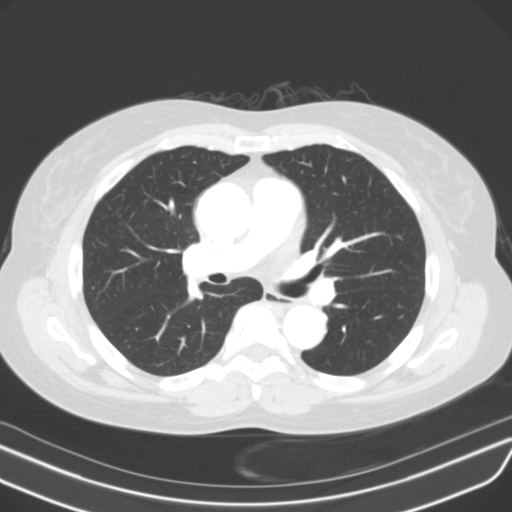
[im 64/105  soft-tissue]
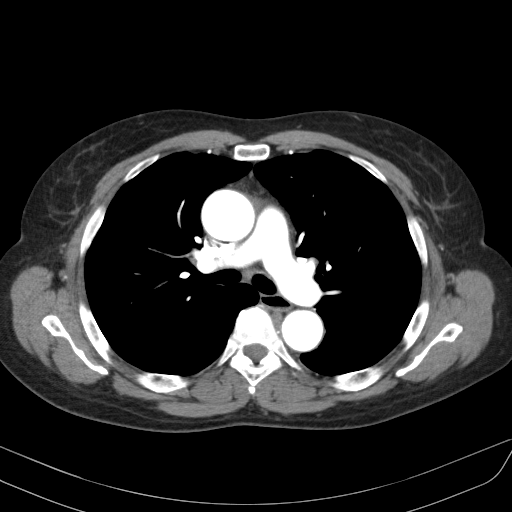
[im 68/105  lung]
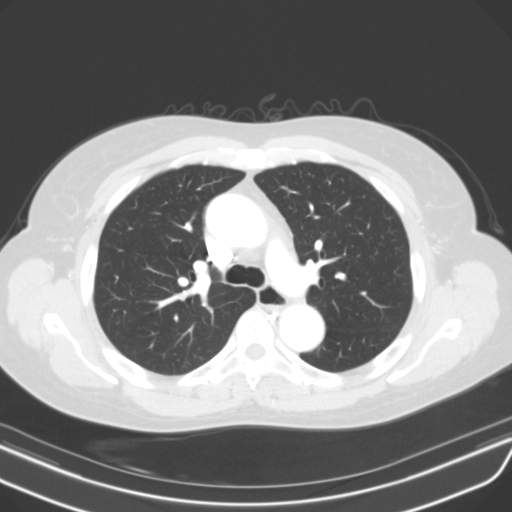
[im 74/105  soft-tissue]
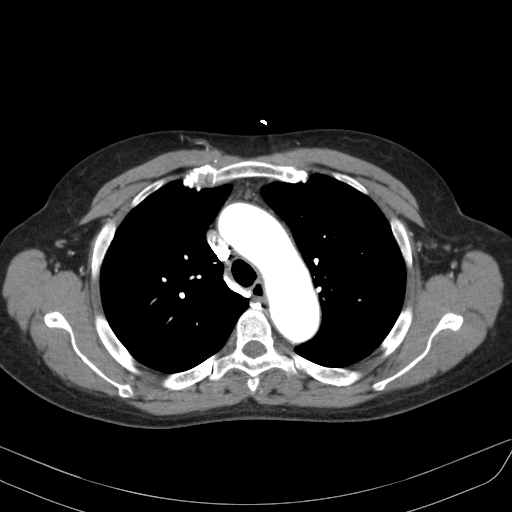
[im 78/105  lung]
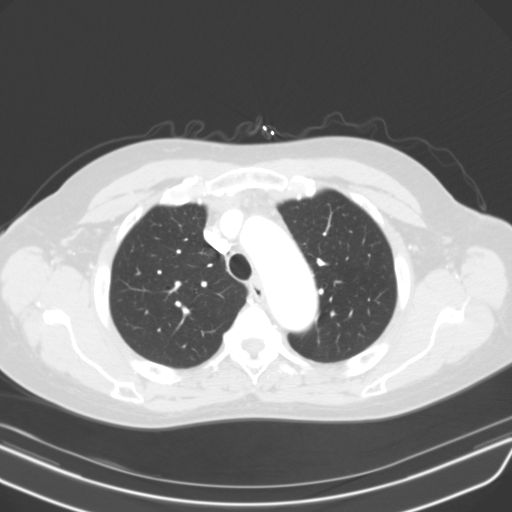
[im 84/105  soft-tissue]
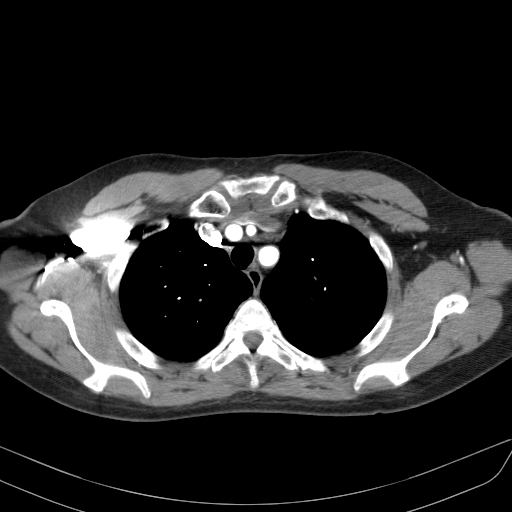
[im 91/105  lung]
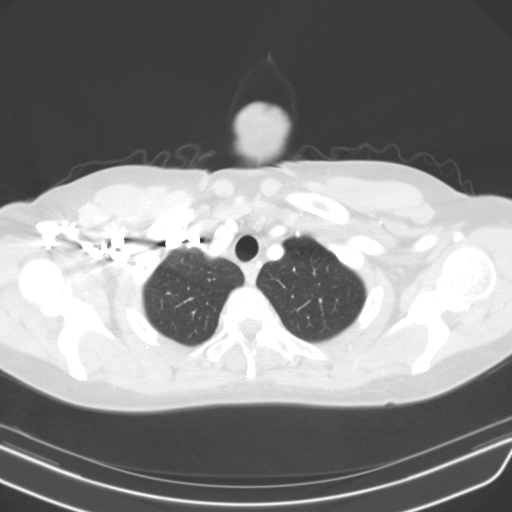
[im 94/105  soft-tissue]
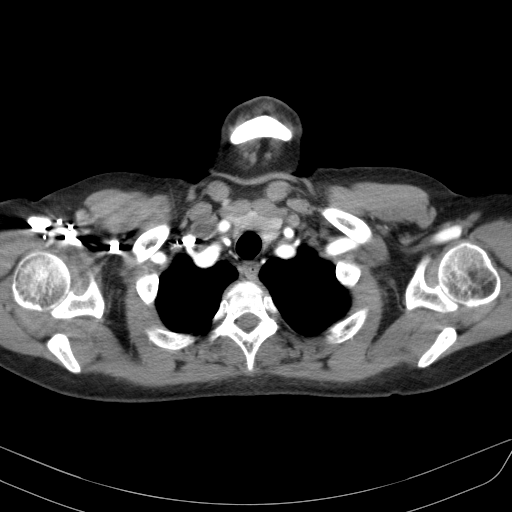
[im 101/105  lung]
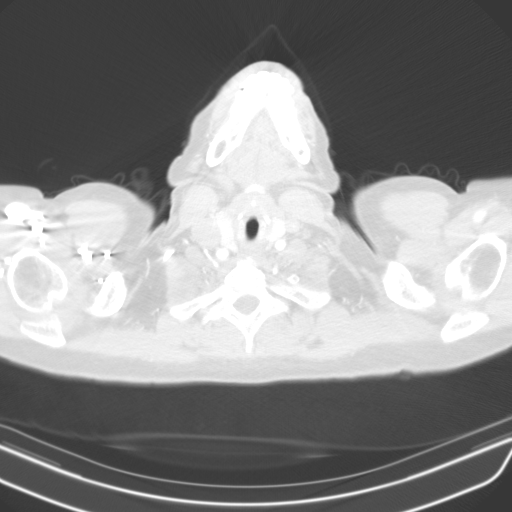

[19 of 32 positions shown; findings below may reference images not displayed]

FINDINGS: Cardiovascular: Ectatic ascending thoracic aorta with a maximal
transverse diameter of 4.0 cm. This is unchanged compared to prior
imaging dating back to at least 6540. Conventional 3 vessel arch
anatomy. The transverse and descending thoracic aorta are normal in
caliber. No significant atherosclerotic plaque. The heart is normal
in size. Normal caliber main pulmonary artery without evidence of
central PE. No pericardial effusion.

Mediastinum/Nodes: Unremarkable CT appearance of the thyroid gland.
No suspicious mediastinal or hilar adenopathy. No soft tissue
mediastinal mass. The thoracic esophagus is unremarkable.

Lungs/Pleura: Lungs are clear. No pleural effusion or pneumothorax.

Upper Abdomen: Stable 1.7 cm left adrenal nodule dating back to at
least Saturday November, 2018. Stability over time is most consistent with
a benign adenoma. No acute abnormality within the visualized upper
abdomen.

Musculoskeletal: No acute fracture or aggressive appearing lytic or
blastic osseous lesion.

Review of the MIP images confirms the above findings.
IMPRESSION: 1. Continued stability of mild aneurysmal dilation of the ascending
thoracic aorta with maximal diameter of 4.0 cm. There has been no
significant interval change dating back to at least Saturday November, 2018. Recommend annual imaging followup by CTA or MRA. This
recommendation follows 2101
ACCF/AHA/AATS/ACR/ASA/SCA/PLANCARTE/BACKUS/MASLOHI/STROH Guidelines for the
Diagnosis and Management of Patients with Thoracic Aortic Disease.
Circulation. 2101; 121: [id number]. Aortic aneurysm NOS
([id number]).
2. Stable left adrenal nodule, likely a benign adenoma.

## 2021-12-01 DIAGNOSIS — L603 Nail dystrophy: Secondary | ICD-10-CM | POA: Diagnosis not present

## 2021-12-01 DIAGNOSIS — B351 Tinea unguium: Secondary | ICD-10-CM | POA: Diagnosis not present

## 2021-12-01 DIAGNOSIS — L600 Ingrowing nail: Secondary | ICD-10-CM | POA: Diagnosis not present

## 2021-12-01 DIAGNOSIS — L84 Corns and callosities: Secondary | ICD-10-CM | POA: Diagnosis not present

## 2021-12-02 ENCOUNTER — Institutional Professional Consult (permissible substitution): Payer: Medicare Other | Admitting: Neurology

## 2021-12-09 DIAGNOSIS — R202 Paresthesia of skin: Secondary | ICD-10-CM | POA: Diagnosis not present

## 2021-12-09 DIAGNOSIS — R6889 Other general symptoms and signs: Secondary | ICD-10-CM | POA: Diagnosis not present

## 2021-12-09 DIAGNOSIS — Z20822 Contact with and (suspected) exposure to covid-19: Secondary | ICD-10-CM | POA: Diagnosis not present

## 2021-12-09 DIAGNOSIS — R7989 Other specified abnormal findings of blood chemistry: Secondary | ICD-10-CM | POA: Diagnosis not present

## 2021-12-09 DIAGNOSIS — R42 Dizziness and giddiness: Secondary | ICD-10-CM | POA: Diagnosis not present

## 2021-12-09 DIAGNOSIS — E860 Dehydration: Secondary | ICD-10-CM | POA: Diagnosis not present

## 2021-12-09 DIAGNOSIS — R0602 Shortness of breath: Secondary | ICD-10-CM | POA: Diagnosis not present

## 2021-12-09 DIAGNOSIS — Z743 Need for continuous supervision: Secondary | ICD-10-CM | POA: Diagnosis not present

## 2021-12-09 DIAGNOSIS — R112 Nausea with vomiting, unspecified: Secondary | ICD-10-CM | POA: Diagnosis not present

## 2021-12-09 DIAGNOSIS — R404 Transient alteration of awareness: Secondary | ICD-10-CM | POA: Diagnosis not present

## 2021-12-09 DIAGNOSIS — R61 Generalized hyperhidrosis: Secondary | ICD-10-CM | POA: Diagnosis not present

## 2021-12-10 DIAGNOSIS — R0602 Shortness of breath: Secondary | ICD-10-CM | POA: Diagnosis not present

## 2021-12-16 DIAGNOSIS — I10 Essential (primary) hypertension: Secondary | ICD-10-CM | POA: Diagnosis not present

## 2021-12-16 DIAGNOSIS — E876 Hypokalemia: Secondary | ICD-10-CM | POA: Diagnosis not present

## 2021-12-16 DIAGNOSIS — H8110 Benign paroxysmal vertigo, unspecified ear: Secondary | ICD-10-CM | POA: Diagnosis not present

## 2021-12-27 DIAGNOSIS — J4521 Mild intermittent asthma with (acute) exacerbation: Secondary | ICD-10-CM | POA: Diagnosis not present

## 2021-12-27 DIAGNOSIS — E785 Hyperlipidemia, unspecified: Secondary | ICD-10-CM | POA: Diagnosis not present

## 2021-12-27 DIAGNOSIS — I10 Essential (primary) hypertension: Secondary | ICD-10-CM | POA: Diagnosis not present

## 2021-12-27 DIAGNOSIS — J4530 Mild persistent asthma, uncomplicated: Secondary | ICD-10-CM | POA: Diagnosis not present

## 2021-12-29 DIAGNOSIS — L603 Nail dystrophy: Secondary | ICD-10-CM | POA: Diagnosis not present

## 2021-12-29 DIAGNOSIS — L600 Ingrowing nail: Secondary | ICD-10-CM | POA: Diagnosis not present

## 2021-12-29 DIAGNOSIS — L84 Corns and callosities: Secondary | ICD-10-CM | POA: Diagnosis not present

## 2021-12-29 DIAGNOSIS — B351 Tinea unguium: Secondary | ICD-10-CM | POA: Diagnosis not present

## 2022-01-06 ENCOUNTER — Telehealth: Payer: Self-pay | Admitting: Neurology

## 2022-01-06 ENCOUNTER — Ambulatory Visit (INDEPENDENT_AMBULATORY_CARE_PROVIDER_SITE_OTHER): Payer: Medicare Other | Admitting: Neurology

## 2022-01-06 VITALS — BP 165/104 | HR 61 | Ht 67.0 in | Wt 190.6 lb

## 2022-01-06 DIAGNOSIS — R293 Abnormal posture: Secondary | ICD-10-CM | POA: Diagnosis not present

## 2022-01-06 DIAGNOSIS — R269 Unspecified abnormalities of gait and mobility: Secondary | ICD-10-CM | POA: Diagnosis not present

## 2022-01-06 DIAGNOSIS — R42 Dizziness and giddiness: Secondary | ICD-10-CM

## 2022-01-06 DIAGNOSIS — M2569 Stiffness of other specified joint, not elsewhere classified: Secondary | ICD-10-CM | POA: Diagnosis not present

## 2022-01-06 DIAGNOSIS — R519 Headache, unspecified: Secondary | ICD-10-CM | POA: Diagnosis not present

## 2022-01-06 DIAGNOSIS — R2681 Unsteadiness on feet: Secondary | ICD-10-CM | POA: Diagnosis not present

## 2022-01-06 DIAGNOSIS — G43709 Chronic migraine without aura, not intractable, without status migrainosus: Secondary | ICD-10-CM

## 2022-01-06 MED ORDER — ALPRAZOLAM 0.5 MG PO TABS: ORAL_TABLET | ORAL | 0 refills | Status: AC

## 2022-01-06 MED ORDER — BUTALBITAL-APAP-CAFFEINE 50-325-40 MG PO TABS: 1.0000 | ORAL_TABLET | Freq: Four times a day (QID) | ORAL | 3 refills | Status: AC | PRN

## 2022-01-06 MED ORDER — VENLAFAXINE HCL ER 37.5 MG PO CP24: 75.0000 mg | ORAL_CAPSULE | Freq: Every day | ORAL | 6 refills | Status: DC
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
## 2022-01-06 NOTE — Telephone Encounter (Signed)
[employer] medicare order sent to [facility], [facility] they will reach out to the patient to schedule.  

## 2022-01-10 DIAGNOSIS — R293 Abnormal posture: Secondary | ICD-10-CM | POA: Diagnosis not present

## 2022-01-10 DIAGNOSIS — R519 Headache, unspecified: Secondary | ICD-10-CM | POA: Diagnosis not present

## 2022-01-10 DIAGNOSIS — M542 Cervicalgia: Secondary | ICD-10-CM | POA: Diagnosis not present

## 2022-01-10 DIAGNOSIS — R42 Dizziness and giddiness: Secondary | ICD-10-CM | POA: Diagnosis not present

## 2022-01-10 DIAGNOSIS — R2681 Unsteadiness on feet: Secondary | ICD-10-CM | POA: Diagnosis not present

## 2022-01-10 DIAGNOSIS — M2569 Stiffness of other specified joint, not elsewhere classified: Secondary | ICD-10-CM | POA: Diagnosis not present

## 2022-01-11 ENCOUNTER — Other Ambulatory Visit: Payer: Self-pay

## 2022-01-11 ENCOUNTER — Ambulatory Visit
Admission: RE | Admit: 2022-01-11 | Discharge: 2022-01-11 | Disposition: A | Discharge status: Home / Self Care | Payer: Medicare Other | Source: Ambulatory Visit | Attending: Neurology | Admitting: Neurology

## 2022-01-11 DIAGNOSIS — R42 Dizziness and giddiness: Secondary | ICD-10-CM | POA: Diagnosis not present

## 2022-01-11 DIAGNOSIS — R269 Unspecified abnormalities of gait and mobility: Secondary | ICD-10-CM

## 2022-01-11 DIAGNOSIS — G43709 Chronic migraine without aura, not intractable, without status migrainosus: Secondary | ICD-10-CM

## 2022-01-11 MED ORDER — GADOBENATE DIMEGLUMINE 529 MG/ML IV SOLN
18.0000 mL | Freq: Once | INTRAVENOUS | Status: AC | PRN
  Administered 2022-01-11: 18 mL via INTRAVENOUS

## 2022-03-01 ENCOUNTER — Telehealth: Payer: Self-pay | Admitting: Neurology

## 2022-03-01 NOTE — Telephone Encounter (Signed)
Patient is wondering if she can do a telephone visit for this appointment?  ?

## 2022-03-01 NOTE — Telephone Encounter (Signed)
Patient had said she does not have smart phone or a mychart account. ?

## 2022-03-01 NOTE — Telephone Encounter (Signed)
Better to be a virtual visit, which can be done on smart phone. ?

## 2022-03-02 NOTE — Telephone Encounter (Signed)
Ok to keep telephone visit ?

## 2022-03-06 ENCOUNTER — Ambulatory Visit (INDEPENDENT_AMBULATORY_CARE_PROVIDER_SITE_OTHER): Payer: Self-pay | Admitting: Neurology

## 2022-03-06 DIAGNOSIS — R42 Dizziness and giddiness: Secondary | ICD-10-CM

## 2022-03-06 DIAGNOSIS — G43709 Chronic migraine without aura, not intractable, without status migrainosus: Secondary | ICD-10-CM

## 2022-03-06 DIAGNOSIS — R269 Unspecified abnormalities of gait and mobility: Secondary | ICD-10-CM

## 2022-03-06 NOTE — Progress Notes (Signed)
Call patient twice without reaching her at scheduled telephone visit today ?

## 2022-03-08 ENCOUNTER — Telehealth: Payer: Self-pay | Admitting: Neurology

## 2022-03-08 DIAGNOSIS — E785 Hyperlipidemia, unspecified: Secondary | ICD-10-CM | POA: Diagnosis not present

## 2022-03-08 DIAGNOSIS — F3341 Major depressive disorder, recurrent, in partial remission: Secondary | ICD-10-CM | POA: Diagnosis not present

## 2022-03-08 DIAGNOSIS — I10 Essential (primary) hypertension: Secondary | ICD-10-CM | POA: Diagnosis not present

## 2022-03-08 DIAGNOSIS — J4530 Mild persistent asthma, uncomplicated: Secondary | ICD-10-CM | POA: Diagnosis not present

## 2022-03-08 NOTE — Telephone Encounter (Addendum)
I tried to locate this pharmacy in [place] and was unsuccessful. I called the pharmacy and spoke with Pharmacist Eliberto Ivory and gave VO for  ?venlafaxine XR (EFFEXOR XR) 37.5 MG 24 hr capsule 75 mg, Daily with breakfast 6 ordered       ? Summary: Take 2 capsules (75 mg total) by mouth daily with breakfast.,   ?  ?# 60 with 6 refills.  ?

## 2022-03-08 NOTE — Telephone Encounter (Signed)
Katie from Dr Lucilla Lame office (pt's pcp) are asking if the venlafaxine XR (EFFEXOR XR) 37.5 MG 24 hr capsule can be called into [facility] [address] in [place]  [phone] ?

## 2022-03-08 NOTE — Addendum Note (Signed)
Addended by: Verlin Grills on: 03/08/2022 02:12 PM ? ? Modules accepted: Orders ? ?

## 2022-03-13 DIAGNOSIS — Z0100 Encounter for examination of eyes and vision without abnormal findings: Secondary | ICD-10-CM | POA: Diagnosis not present

## 2022-04-17 ENCOUNTER — Telehealth: Payer: Self-pay | Admitting: *Deleted

## 2022-04-17 NOTE — Telephone Encounter (Signed)
Pt calling for results please call [phone] ?

## 2022-04-17 NOTE — Telephone Encounter (Signed)
I spoke to the patient. She missed her appt on 03/06/22. Rescheduled to 05/09/22. She wanted the results from her MRI brain/IAC w/wo contrast scan on 01/11/22. I provided her with the information below: ? ?IMPRESSION:   ?Unremarkable MRI brain and IAC (with and without). No acute findings. ?_____________________________________ ? ?  ?

## 2022-05-11 ENCOUNTER — Ambulatory Visit (INDEPENDENT_AMBULATORY_CARE_PROVIDER_SITE_OTHER): Payer: No Typology Code available for payment source | Admitting: Neurology

## 2022-05-11 ENCOUNTER — Encounter: Payer: Self-pay | Admitting: Neurology

## 2022-05-11 VITALS — BP 151/97 | HR 61 | Ht 67.0 in | Wt 192.0 lb

## 2022-05-11 DIAGNOSIS — F32A Depression, unspecified: Secondary | ICD-10-CM

## 2022-05-11 DIAGNOSIS — R42 Dizziness and giddiness: Secondary | ICD-10-CM | POA: Diagnosis not present

## 2022-05-11 DIAGNOSIS — G43709 Chronic migraine without aura, not intractable, without status migrainosus: Secondary | ICD-10-CM | POA: Diagnosis not present

## 2022-05-11 MED ORDER — VENLAFAXINE HCL ER 37.5 MG PO CP24: 75.0000 mg | ORAL_CAPSULE | Freq: Every day | ORAL | 11 refills | Status: AC

## 2022-05-11 MED ORDER — NORTRIPTYLINE HCL 10 MG PO CAPS: 30.0000 mg | ORAL_CAPSULE | Freq: Every day | ORAL | 3 refills | Status: AC
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
## 2022-06-01 ENCOUNTER — Other Ambulatory Visit: Payer: Self-pay | Admitting: Family Medicine

## 2022-06-01 DIAGNOSIS — E559 Vitamin D deficiency, unspecified: Secondary | ICD-10-CM | POA: Diagnosis not present

## 2022-06-01 DIAGNOSIS — F419 Anxiety disorder, unspecified: Secondary | ICD-10-CM | POA: Diagnosis not present

## 2022-06-01 DIAGNOSIS — R300 Dysuria: Secondary | ICD-10-CM | POA: Diagnosis not present

## 2022-06-01 DIAGNOSIS — K219 Gastro-esophageal reflux disease without esophagitis: Secondary | ICD-10-CM | POA: Diagnosis not present

## 2022-06-01 DIAGNOSIS — I10 Essential (primary) hypertension: Secondary | ICD-10-CM | POA: Diagnosis not present

## 2022-06-01 DIAGNOSIS — I77810 Thoracic aortic ectasia: Secondary | ICD-10-CM

## 2022-06-01 DIAGNOSIS — Z113 Encounter for screening for infections with a predominantly sexual mode of transmission: Secondary | ICD-10-CM | POA: Diagnosis not present

## 2022-06-01 DIAGNOSIS — F3341 Major depressive disorder, recurrent, in partial remission: Secondary | ICD-10-CM | POA: Diagnosis not present

## 2022-06-01 DIAGNOSIS — G43909 Migraine, unspecified, not intractable, without status migrainosus: Secondary | ICD-10-CM | POA: Diagnosis not present

## 2022-06-01 DIAGNOSIS — J4530 Mild persistent asthma, uncomplicated: Secondary | ICD-10-CM | POA: Diagnosis not present

## 2022-06-01 DIAGNOSIS — Z0000 Encounter for general adult medical examination without abnormal findings: Secondary | ICD-10-CM | POA: Diagnosis not present

## 2022-06-01 DIAGNOSIS — G4700 Insomnia, unspecified: Secondary | ICD-10-CM | POA: Diagnosis not present

## 2022-06-01 DIAGNOSIS — E785 Hyperlipidemia, unspecified: Secondary | ICD-10-CM | POA: Diagnosis not present

## 2022-06-12 ENCOUNTER — Other Ambulatory Visit: Payer: Self-pay | Admitting: Sports Medicine

## 2022-06-12 ENCOUNTER — Ambulatory Visit
Admission: RE | Admit: 2022-06-12 | Discharge: 2022-06-12 | Disposition: A | Discharge status: Home / Self Care | Payer: No Typology Code available for payment source | Source: Ambulatory Visit | Attending: Sports Medicine | Admitting: Sports Medicine

## 2022-06-12 DIAGNOSIS — R52 Pain, unspecified: Secondary | ICD-10-CM

## 2022-06-12 DIAGNOSIS — M25561 Pain in right knee: Secondary | ICD-10-CM | POA: Diagnosis not present

## 2022-06-12 DIAGNOSIS — M170 Bilateral primary osteoarthritis of knee: Secondary | ICD-10-CM | POA: Diagnosis not present

## 2022-06-12 DIAGNOSIS — M25562 Pain in left knee: Secondary | ICD-10-CM | POA: Diagnosis not present

## 2022-06-13 DIAGNOSIS — Z1231 Encounter for screening mammogram for malignant neoplasm of breast: Secondary | ICD-10-CM | POA: Diagnosis not present

## 2022-06-13 DIAGNOSIS — M85852 Other specified disorders of bone density and structure, left thigh: Secondary | ICD-10-CM | POA: Diagnosis not present

## 2022-06-13 DIAGNOSIS — Z780 Asymptomatic menopausal state: Secondary | ICD-10-CM | POA: Diagnosis not present

## 2022-06-13 DIAGNOSIS — M85851 Other specified disorders of bone density and structure, right thigh: Secondary | ICD-10-CM | POA: Diagnosis not present

## 2022-06-30 ENCOUNTER — Other Ambulatory Visit: Payer: Medicare Other

## 2022-07-12 ENCOUNTER — Other Ambulatory Visit: Payer: Medicare Other

## 2022-07-26 ENCOUNTER — Inpatient Hospital Stay: Admission: RE | Admit: 2022-07-26 | Payer: Medicare Other | Source: Ambulatory Visit

## 2022-08-21 ENCOUNTER — Inpatient Hospital Stay: Admission: RE | Admit: 2022-08-21 | Payer: No Typology Code available for payment source | Source: Ambulatory Visit

## 2022-08-28 DIAGNOSIS — Z6832 Body mass index (BMI) 32.0-32.9, adult: Secondary | ICD-10-CM | POA: Diagnosis not present

## 2022-08-28 DIAGNOSIS — G4700 Insomnia, unspecified: Secondary | ICD-10-CM | POA: Diagnosis not present

## 2022-08-28 DIAGNOSIS — E669 Obesity, unspecified: Secondary | ICD-10-CM | POA: Diagnosis not present

## 2022-08-28 DIAGNOSIS — J45909 Unspecified asthma, uncomplicated: Secondary | ICD-10-CM | POA: Diagnosis not present

## 2022-08-28 DIAGNOSIS — R2681 Unsteadiness on feet: Secondary | ICD-10-CM | POA: Diagnosis not present

## 2022-08-28 DIAGNOSIS — Z008 Encounter for other general examination: Secondary | ICD-10-CM | POA: Diagnosis not present

## 2022-08-28 DIAGNOSIS — F17210 Nicotine dependence, cigarettes, uncomplicated: Secondary | ICD-10-CM | POA: Diagnosis not present

## 2022-08-28 DIAGNOSIS — M1990 Unspecified osteoarthritis, unspecified site: Secondary | ICD-10-CM | POA: Diagnosis not present

## 2022-08-28 DIAGNOSIS — I77819 Aortic ectasia, unspecified site: Secondary | ICD-10-CM | POA: Diagnosis not present

## 2022-08-28 DIAGNOSIS — I10 Essential (primary) hypertension: Secondary | ICD-10-CM | POA: Diagnosis not present

## 2022-09-20 DIAGNOSIS — R69 Illness, unspecified: Secondary | ICD-10-CM | POA: Diagnosis not present

## 2022-10-04 DIAGNOSIS — R69 Illness, unspecified: Secondary | ICD-10-CM | POA: Diagnosis not present

## 2022-10-11 DIAGNOSIS — B379 Candidiasis, unspecified: Secondary | ICD-10-CM | POA: Diagnosis not present

## 2022-10-11 DIAGNOSIS — M5431 Sciatica, right side: Secondary | ICD-10-CM | POA: Diagnosis not present

## 2023-06-21 ENCOUNTER — Emergency Department (HOSPITAL_BASED_OUTPATIENT_CLINIC_OR_DEPARTMENT_OTHER)
Admission: EM | Admit: 2023-06-21 | Discharge: 2023-06-21 | Disposition: A | Discharge status: Home / Self Care | Payer: Medicare HMO | Attending: Emergency Medicine | Admitting: Emergency Medicine

## 2023-06-21 ENCOUNTER — Other Ambulatory Visit: Payer: Self-pay

## 2023-06-21 ENCOUNTER — Encounter (HOSPITAL_BASED_OUTPATIENT_CLINIC_OR_DEPARTMENT_OTHER): Payer: Self-pay

## 2023-06-21 DIAGNOSIS — R0689 Other abnormalities of breathing: Secondary | ICD-10-CM | POA: Diagnosis not present

## 2023-06-21 DIAGNOSIS — R001 Bradycardia, unspecified: Secondary | ICD-10-CM | POA: Diagnosis not present

## 2023-06-21 DIAGNOSIS — R110 Nausea: Secondary | ICD-10-CM | POA: Diagnosis not present

## 2023-06-21 DIAGNOSIS — R231 Pallor: Secondary | ICD-10-CM | POA: Diagnosis not present

## 2023-06-21 DIAGNOSIS — K529 Noninfective gastroenteritis and colitis, unspecified: Secondary | ICD-10-CM | POA: Diagnosis not present

## 2023-06-21 DIAGNOSIS — R1013 Epigastric pain: Secondary | ICD-10-CM | POA: Diagnosis not present

## 2023-06-21 DIAGNOSIS — R6883 Chills (without fever): Secondary | ICD-10-CM | POA: Diagnosis not present

## 2023-06-21 DIAGNOSIS — R197 Diarrhea, unspecified: Secondary | ICD-10-CM | POA: Insufficient documentation

## 2023-06-21 LAB — COMPREHENSIVE METABOLIC PANEL
ALT: 7 U/L (ref 0–44)
AST: 12 U/L — ABNORMAL LOW (ref 15–41)
Albumin: 4.3 g/dL (ref 3.5–5.0)
Alkaline Phosphatase: 57 U/L (ref 38–126)
Anion gap: 10 (ref 5–15)
BUN: 17 mg/dL (ref 8–23)
CO2: 25 mmol/L (ref 22–32)
Calcium: 9.4 mg/dL (ref 8.9–10.3)
Chloride: 103 mmol/L (ref 98–111)
Creatinine, Ser: 0.93 mg/dL (ref 0.44–1.00)
GFR, Estimated: >60 mL/min (ref >60)
Glucose, Bld: 87 mg/dL (ref 70–99)
Potassium: 3.4 mmol/L — ABNORMAL LOW (ref 3.5–5.1)
Sodium: 138 mmol/L (ref 135–145)
Total Bilirubin: 0.4 mg/dL (ref 0.3–1.2)
Total Protein: 7.7 g/dL (ref 6.5–8.1)

## 2023-06-21 LAB — LIPASE, BLOOD: Lipase: 12 U/L (ref 11–51)

## 2023-06-21 LAB — URINALYSIS, ROUTINE W REFLEX MICROSCOPIC
Bacteria, UA: NONE SEEN
Bilirubin Urine: NEGATIVE
Glucose, UA: NEGATIVE mg/dL
Ketones, ur: NEGATIVE mg/dL
Leukocytes,Ua: NEGATIVE
Nitrite: NEGATIVE
Specific Gravity, Urine: 1.029 (ref 1.005–1.030)
pH: 5.5 (ref 5.0–8.0)

## 2023-06-21 LAB — CBC
HCT: 36.8 % (ref 36.0–46.0)
Hemoglobin: 12.1 g/dL (ref 12.0–15.0)
MCH: 28.5 pg (ref 26.0–34.0)
MCHC: 32.9 g/dL (ref 30.0–36.0)
MCV: 86.8 fL (ref 80.0–100.0)
Platelets: 268 10*3/uL (ref 150–400)
RBC: 4.24 MIL/uL (ref 3.87–5.11)
RDW: 14.6 % (ref 11.5–15.5)
WBC: 4.6 10*3/uL (ref 4.0–10.5)
nRBC: 0 % (ref 0.0–0.2)

## 2023-06-21 MED ORDER — ALUM & MAG HYDROXIDE-SIMETH 200-200-20 MG/5ML PO SUSP
30.0000 mL | Freq: Once | ORAL | Status: AC
  Administered 2023-06-21: 30 mL via ORAL
  Filled 2023-06-21: qty 30

## 2023-06-21 MED ORDER — ONDANSETRON 4 MG PO TBDP: 4.0000 mg | ORAL_TABLET | Freq: Three times a day (TID) | ORAL | 0 refills | Status: AC | PRN

## 2023-06-21 MED ORDER — AMLODIPINE BESYLATE 5 MG PO TABS
10.0000 mg | ORAL_TABLET | Freq: Once | ORAL | Status: AC
  Administered 2023-06-21: 10 mg via ORAL
  Filled 2023-06-21: qty 2

## 2023-06-21 MED ORDER — ONDANSETRON HCL 4 MG/2ML IJ SOLN
4.0000 mg | Freq: Once | INTRAMUSCULAR | Status: AC
  Administered 2023-06-21: 4 mg via INTRAVENOUS
  Filled 2023-06-21: qty 2

## 2023-06-21 MED ORDER — SODIUM CHLORIDE 0.9 % IV BOLUS
1000.0000 mL | Freq: Once | INTRAVENOUS | Status: AC
  Administered 2023-06-21: 1000 mL via INTRAVENOUS

## 2023-06-21 MED ORDER — FAMOTIDINE 20 MG PO TABS
20.0000 mg | ORAL_TABLET | Freq: Once | ORAL | Status: AC
  Administered 2023-06-21: 20 mg via ORAL
  Filled 2023-06-21: qty 1

## 2023-06-21 NOTE — ED Provider Notes (Signed)
[place] EMERGENCY DEPARTMENT AT [facility] Provider Note   CSN: [id number] Arrival date & time: 06/21/23  1559     History Chief Complaint  Patient presents with   Abdominal Pain    Krista Santana is a 67 y.o. female patient who presents to the emergency department today for further evaluation of nausea, chills, clamminess, and diarrhea that started yesterday after eating some spaghetti at lunch.  She has not had any vomiting but she says she has had pretty intense nausea.  She denies any headache, urinary symptoms.  She does endorse associated epigastric abdominal pain and abdominal bloating.   Abdominal Pain      Home Medications Prior to Admission medications   Medication Sig Start Date End Date Taking? Authorizing Provider  ondansetron (ZOFRAN-ODT) 4 MG disintegrating tablet Take 1 tablet (4 mg total) by mouth every 8 (eight) hours as needed for nausea or vomiting. 06/21/23  Yes Honor Loh M, PA-C  acyclovir (ZOVIRAX) 200 MG capsule  01/15/19   [provider]  albuterol (VENTOLIN HFA) 108 (90 Base) MCG/ACT inhaler  09/23/19   [provider]  ALPRAZolam Prudy Feeler) 0.5 MG tablet Take 1-2 tablets 30 minutes prior to MRI, may repeat once as needed. Must have driver. 01/06/22   Levert Feinstein, MD  amLODipine (NORVASC) 10 MG tablet Take 10 mg by mouth daily.    [provider]  butalbital-acetaminophen-caffeine (FIORICET) 50-325-40 MG tablet Take 1 tablet by mouth every 6 (six) hours as needed for headache. 01/06/22   Levert Feinstein, MD  Cholecalciferol (VITAMIN D3) 5000 units CAPS Take 5,000 Units by mouth 2 (two) times a week.     [provider]  cyclobenzaprine (FLEXERIL) 10 MG tablet Take 5-10 mg by mouth as needed for muscle spasms.    [provider]  nortriptyline (PAMELOR) 10 MG capsule Take 3 capsules (30 mg total) by mouth at bedtime. 05/11/22   Levert Feinstein, MD  polyethylene glycol-electrolytes (NULYTELY/GOLYTELY) 420 g solution  See admin instructions. 07/11/19   [provider]  SUMAtriptan (IMITREX) 100 MG tablet Take 1 tablet (100 mg total) by mouth 2 (two) times daily as needed for migraine. 05/11/17   York Spaniel, MD  venlafaxine XR (EFFEXOR XR) 37.5 MG 24 hr capsule Take 2 capsules (75 mg total) by mouth daily with breakfast. 05/11/22   Levert Feinstein, MD      Allergies    Shellfish allergy, Shellfish-derived products, Oxycodone-acetaminophen, Penicillins, Percocet [oxycodone-acetaminophen], and Zoloft [sertraline]    Review of Systems   Review of Systems  Gastrointestinal:  Positive for abdominal pain.  All other systems reviewed and are negative.   Physical Exam Updated Vital Signs BP (!) 152/108 (BP Location: Right Arm)   Pulse (!) 56   Temp 98.3 F (36.8 C) (Oral)   Resp 14   Ht 5\' 7"  (1.702 m)   Wt 79.4 kg   SpO2 100%   BMI 27.41 kg/m  Physical Exam Vitals and nursing note reviewed.  Constitutional:      General: She is not in acute distress.    Appearance: Normal appearance.  HENT:     Head: Normocephalic and atraumatic.  Eyes:     General:        Right eye: No discharge.        Left eye: No discharge.  Cardiovascular:     Comments: Regular rate and rhythm.  S1/S2 are distinct without any evidence of murmur, rubs, or gallops.  Radial pulses are 2+ bilaterally.  Dorsalis pedis pulses are 2+ bilaterally.  No evidence of pedal edema. Pulmonary:     Comments: Clear to auscultation bilaterally.  Normal effort.  No respiratory distress.  No evidence of wheezes, rales, or rhonchi heard throughout. Abdominal:     General: Abdomen is flat. Bowel sounds are normal. There is no distension.     Tenderness: There is abdominal tenderness in the epigastric area. There is no guarding or rebound.  Musculoskeletal:        General: Normal range of motion.     Cervical back: Neck supple.  Skin:    General: Skin is warm and dry.     Findings: No rash.  Neurological:     General: No focal  deficit present.     Mental Status: She is alert.  Psychiatric:        Mood and Affect: Mood normal.        Behavior: Behavior normal.     ED Results / Procedures / Treatments   Labs (all labs ordered are listed, but only abnormal results are displayed) Labs Reviewed  COMPREHENSIVE METABOLIC PANEL - Abnormal; Notable for the following components:      Result Value   Potassium 3.4 (*)    AST 12 (*)    All other components within normal limits  URINALYSIS, ROUTINE W REFLEX MICROSCOPIC - Abnormal; Notable for the following components:   Hgb urine dipstick MODERATE (*)    Protein, ur TRACE (*)    All other components within normal limits  LIPASE, BLOOD  CBC    EKG None  Radiology No results found.  Procedures Procedures   Medications Ordered in ED Medications  sodium chloride 0.9 % bolus 1,000 mL (1,000 mLs Intravenous New Bag/Given 06/21/23 1731)  ondansetron (ZOFRAN) injection 4 mg (4 mg Intravenous Given 06/21/23 1730)  alum & mag hydroxide-simeth (MAALOX/MYLANTA) 200-200-20 MG/5ML suspension 30 mL (30 mLs Oral Given 06/21/23 1745)  famotidine (PEPCID) tablet 20 mg (20 mg Oral Given 06/21/23 1814)    ED Course/ Medical Decision Making/ A&P Clinical Course as of 06/21/23 1847  Thu Jun 21, 2023  1738 Stable abdominal pain, Fluids/GI and reassess. If improved dispo. [CC]  1747 Lipase, blood Normal. [CF]  1747 Urinalysis, Routine w reflex microscopic -Urine, Clean Catch(!) Moderate amount of hematuria but no significant signs of infection. [CF]  1748 Comprehensive metabolic panel(!) Normal. [CF]  1749 CBC Normal. [CF]  1844 Patient still having some mild epigastric abdominal pain but overall feeling better and willing to go home.  Emina prescribe her Zofran to go home with and she will get Pepcid over-the-counter.  All questions or concerns addressed. [CF]    Clinical Course User Index [CC] Krista Ade, MD [CF] Krista Lower, PA-C   {   Click here for  ABCD2, HEART and other calculators  Medical Decision Making [name] is a 67 y.o. female patient who presents to the emergency department today for further evaluation of nausea, epigastric abdominal pain, and diarrhea.  This is likely viral gastroenteritis.  Patient does not have any other significant abdominal pain.  I do not feel imaging is warranted at this time.  Will plan to give fluids, get labs, and give her some nausea medicine and GI cocktail.  Patient feeling better and willing to go home.  I given her Zofran and she will pick up Pepcid over-the-counter as highlighted in ED course.  All questions or concerns.  Strict return precaution were discussed.  This is likely viral gastroenteritis.  She is safe for discharge at this time.  Amount and/or Complexity of Data Reviewed Labs: ordered. Decision-making details documented in ED Course.  Risk OTC drugs. Prescription drug management.    Final Clinical Impression(s) / ED Diagnoses Final diagnoses:  Epigastric abdominal pain  Nausea    Rx / DC Orders ED Discharge Orders          Ordered    ondansetron (ZOFRAN-ODT) 4 MG disintegrating tablet  Every 8 hours PRN        06/21/23 1846              Krista Lower, PA-C 06/21/23 1847    Krista Ade, MD 06/21/23 2332

## 2023-06-21 NOTE — ED Triage Notes (Signed)
Patient here POV from Home.  Endorses eating Spaghetti yesterday and since then began to have some nausea, ABD Distention, Diarrhea. No confirmed Fevers.  NAD Noted during triage. A&Ox4. Gcs 15. Ambulatory.

## 2023-06-21 NOTE — Discharge Instructions (Signed)
As we discussed, I would take Zofran as needed for nausea.  I would stick to a lighter diet including bananas, applesauce, soups, and soup broths, and water.  I would also go pick up Pepcid over-the-counter which can help with that gnawing burning pain that you are experiencing in your stomach.  Other than that I would like for you to follow-up with your primary care doctor or return to the emergency room for any worsening symptoms.

## 2023-06-21 NOTE — ED Notes (Signed)
 RN reviewed discharge instructions with pt. Pt verbalized understanding and had no further questions. VSS upon discharge.  

## 2023-06-28 DIAGNOSIS — H9192 Unspecified hearing loss, left ear: Secondary | ICD-10-CM | POA: Diagnosis not present

## 2023-06-28 DIAGNOSIS — I10 Essential (primary) hypertension: Secondary | ICD-10-CM | POA: Diagnosis not present

## 2023-06-28 DIAGNOSIS — A084 Viral intestinal infection, unspecified: Secondary | ICD-10-CM | POA: Diagnosis not present

## 2023-06-28 DIAGNOSIS — K219 Gastro-esophageal reflux disease without esophagitis: Secondary | ICD-10-CM | POA: Diagnosis not present

## 2023-06-28 DIAGNOSIS — E559 Vitamin D deficiency, unspecified: Secondary | ICD-10-CM | POA: Diagnosis not present

## 2023-08-04 DIAGNOSIS — H35033 Hypertensive retinopathy, bilateral: Secondary | ICD-10-CM | POA: Diagnosis not present

## 2023-08-04 DIAGNOSIS — Z0100 Encounter for examination of eyes and vision without abnormal findings: Secondary | ICD-10-CM | POA: Diagnosis not present

## 2023-08-04 DIAGNOSIS — H524 Presbyopia: Secondary | ICD-10-CM | POA: Diagnosis not present

## 2023-08-04 DIAGNOSIS — H04123 Dry eye syndrome of bilateral lacrimal glands: Secondary | ICD-10-CM | POA: Diagnosis not present

## 2023-08-14 DIAGNOSIS — M8588 Other specified disorders of bone density and structure, other site: Secondary | ICD-10-CM | POA: Diagnosis not present

## 2023-08-14 DIAGNOSIS — G4700 Insomnia, unspecified: Secondary | ICD-10-CM | POA: Diagnosis not present

## 2023-08-14 DIAGNOSIS — Z0000 Encounter for general adult medical examination without abnormal findings: Secondary | ICD-10-CM | POA: Diagnosis not present

## 2023-08-14 DIAGNOSIS — M170 Bilateral primary osteoarthritis of knee: Secondary | ICD-10-CM | POA: Diagnosis not present

## 2023-08-14 DIAGNOSIS — E559 Vitamin D deficiency, unspecified: Secondary | ICD-10-CM | POA: Diagnosis not present

## 2023-08-14 DIAGNOSIS — Z23 Encounter for immunization: Secondary | ICD-10-CM | POA: Diagnosis not present

## 2023-08-14 DIAGNOSIS — E785 Hyperlipidemia, unspecified: Secondary | ICD-10-CM | POA: Diagnosis not present

## 2023-08-14 DIAGNOSIS — J4530 Mild persistent asthma, uncomplicated: Secondary | ICD-10-CM | POA: Diagnosis not present

## 2023-08-14 DIAGNOSIS — I10 Essential (primary) hypertension: Secondary | ICD-10-CM | POA: Diagnosis not present

## 2023-08-15 DIAGNOSIS — H903 Sensorineural hearing loss, bilateral: Secondary | ICD-10-CM | POA: Diagnosis not present

## 2023-08-15 DIAGNOSIS — H9313 Tinnitus, bilateral: Secondary | ICD-10-CM | POA: Diagnosis not present

## 2023-08-15 DIAGNOSIS — H90A21 Sensorineural hearing loss, unilateral, right ear, with restricted hearing on the contralateral side: Secondary | ICD-10-CM | POA: Diagnosis not present

## 2023-08-15 DIAGNOSIS — H90A32 Mixed conductive and sensorineural hearing loss, unilateral, left ear with restricted hearing on the contralateral side: Secondary | ICD-10-CM | POA: Diagnosis not present

## 2023-08-15 DIAGNOSIS — R42 Dizziness and giddiness: Secondary | ICD-10-CM | POA: Diagnosis not present

## 2023-08-17 ENCOUNTER — Other Ambulatory Visit: Payer: Self-pay | Admitting: Family Medicine

## 2023-08-17 DIAGNOSIS — I77810 Thoracic aortic ectasia: Secondary | ICD-10-CM

## 2023-09-10 ENCOUNTER — Inpatient Hospital Stay: Admission: RE | Admit: 2023-09-10 | Payer: Medicare Other | Source: Ambulatory Visit

## 2023-09-10 ENCOUNTER — Ambulatory Visit
Admission: RE | Admit: 2023-09-10 | Discharge: 2023-09-10 | Disposition: A | Discharge status: Home / Self Care | Payer: Medicare Other | Source: Ambulatory Visit | Attending: Family Medicine | Admitting: Family Medicine

## 2023-09-10 DIAGNOSIS — N281 Cyst of kidney, acquired: Secondary | ICD-10-CM | POA: Diagnosis not present

## 2023-09-10 DIAGNOSIS — I7121 Aneurysm of the ascending aorta, without rupture: Secondary | ICD-10-CM | POA: Diagnosis not present

## 2023-09-10 DIAGNOSIS — I7123 Aneurysm of the descending thoracic aorta, without rupture: Secondary | ICD-10-CM | POA: Diagnosis not present

## 2023-09-10 DIAGNOSIS — I77810 Thoracic aortic ectasia: Secondary | ICD-10-CM

## 2023-09-10 DIAGNOSIS — R002 Palpitations: Secondary | ICD-10-CM | POA: Diagnosis not present

## 2023-09-10 MED ORDER — IOPAMIDOL (ISOVUE-370) INJECTION 76%
75.0000 mL | Freq: Once | INTRAVENOUS | Status: AC | PRN
  Administered 2023-09-10: 75 mL via INTRAVENOUS

## 2023-09-18 ENCOUNTER — Other Ambulatory Visit: Payer: Self-pay | Admitting: Family Medicine

## 2023-09-18 DIAGNOSIS — N281 Cyst of kidney, acquired: Secondary | ICD-10-CM

## 2023-09-24 ENCOUNTER — Ambulatory Visit
Admission: RE | Admit: 2023-09-24 | Discharge: 2023-09-24 | Disposition: A | Discharge status: Home / Self Care | Payer: Medicare Other | Source: Ambulatory Visit | Attending: Family Medicine | Admitting: Family Medicine

## 2023-09-24 DIAGNOSIS — D3502 Benign neoplasm of left adrenal gland: Secondary | ICD-10-CM | POA: Diagnosis not present

## 2023-09-24 DIAGNOSIS — K8020 Calculus of gallbladder without cholecystitis without obstruction: Secondary | ICD-10-CM | POA: Diagnosis not present

## 2023-09-24 DIAGNOSIS — K828 Other specified diseases of gallbladder: Secondary | ICD-10-CM | POA: Diagnosis not present

## 2023-09-24 DIAGNOSIS — N281 Cyst of kidney, acquired: Secondary | ICD-10-CM | POA: Diagnosis not present

## 2023-09-24 MED ORDER — IOPAMIDOL (ISOVUE-370) INJECTION 76%
100.0000 mL | Freq: Once | INTRAVENOUS | Status: AC | PRN
  Administered 2023-09-24: 100 mL via INTRAVENOUS

## 2023-09-27 ENCOUNTER — Other Ambulatory Visit: Payer: Self-pay | Admitting: Family Medicine

## 2023-09-27 DIAGNOSIS — R0989 Other specified symptoms and signs involving the circulatory and respiratory systems: Secondary | ICD-10-CM

## 2023-10-02 ENCOUNTER — Ambulatory Visit
Admission: RE | Admit: 2023-10-02 | Discharge: 2023-10-02 | Disposition: A | Discharge status: Home / Self Care | Payer: Medicare Other | Source: Ambulatory Visit | Attending: Family Medicine | Admitting: Family Medicine

## 2023-10-02 DIAGNOSIS — R0989 Other specified symptoms and signs involving the circulatory and respiratory systems: Secondary | ICD-10-CM

## 2023-10-02 DIAGNOSIS — R9089 Other abnormal findings on diagnostic imaging of central nervous system: Secondary | ICD-10-CM | POA: Diagnosis not present

## 2023-11-03 DIAGNOSIS — H2513 Age-related nuclear cataract, bilateral: Secondary | ICD-10-CM | POA: Diagnosis not present

## 2023-11-03 DIAGNOSIS — H04123 Dry eye syndrome of bilateral lacrimal glands: Secondary | ICD-10-CM | POA: Diagnosis not present

## 2024-01-31 ENCOUNTER — Emergency Department (HOSPITAL_BASED_OUTPATIENT_CLINIC_OR_DEPARTMENT_OTHER)
Admission: EM | Admit: 2024-01-31 | Discharge: 2024-02-01 | Disposition: A | Discharge status: Home / Self Care | Payer: Medicare Other | Attending: Emergency Medicine | Admitting: Emergency Medicine

## 2024-01-31 ENCOUNTER — Emergency Department (HOSPITAL_BASED_OUTPATIENT_CLINIC_OR_DEPARTMENT_OTHER): Payer: Medicare Other

## 2024-01-31 ENCOUNTER — Other Ambulatory Visit: Payer: Self-pay

## 2024-01-31 ENCOUNTER — Encounter (HOSPITAL_BASED_OUTPATIENT_CLINIC_OR_DEPARTMENT_OTHER): Payer: Self-pay | Admitting: Emergency Medicine

## 2024-01-31 DIAGNOSIS — I10 Essential (primary) hypertension: Secondary | ICD-10-CM | POA: Diagnosis not present

## 2024-01-31 DIAGNOSIS — R072 Precordial pain: Secondary | ICD-10-CM | POA: Insufficient documentation

## 2024-01-31 DIAGNOSIS — E876 Hypokalemia: Secondary | ICD-10-CM | POA: Diagnosis not present

## 2024-01-31 DIAGNOSIS — R110 Nausea: Secondary | ICD-10-CM | POA: Diagnosis not present

## 2024-01-31 DIAGNOSIS — R079 Chest pain, unspecified: Secondary | ICD-10-CM | POA: Diagnosis not present

## 2024-01-31 LAB — HEPATIC FUNCTION PANEL
ALT: 12 U/L (ref 0–44)
AST: 16 U/L (ref 15–41)
Albumin: 3.9 g/dL (ref 3.5–5.0)
Alkaline Phosphatase: 71 U/L (ref 38–126)
Bilirubin, Direct: 0.1 mg/dL (ref 0.0–0.2)
Indirect Bilirubin: 0.3 mg/dL (ref 0.3–0.9)
Total Bilirubin: 0.4 mg/dL (ref 0.0–1.2)
Total Protein: 7.7 g/dL (ref 6.5–8.1)

## 2024-01-31 LAB — CBC
HCT: 35.7 % — ABNORMAL LOW (ref 36.0–46.0)
Hemoglobin: 11.6 g/dL — ABNORMAL LOW (ref 12.0–15.0)
MCH: 28.1 pg (ref 26.0–34.0)
MCHC: 32.5 g/dL (ref 30.0–36.0)
MCV: 86.4 fL (ref 80.0–100.0)
Platelets: 248 10*3/uL (ref 150–400)
RBC: 4.13 MIL/uL (ref 3.87–5.11)
RDW: 13.9 % (ref 11.5–15.5)
WBC: 5.2 10*3/uL (ref 4.0–10.5)
nRBC: 0 % (ref 0.0–0.2)

## 2024-01-31 LAB — LIPASE, BLOOD: Lipase: 29 U/L (ref 11–51)

## 2024-01-31 LAB — BASIC METABOLIC PANEL
Anion gap: 8 (ref 5–15)
BUN: 20 mg/dL (ref 8–23)
CO2: 29 mmol/L (ref 22–32)
Calcium: 8.8 mg/dL — ABNORMAL LOW (ref 8.9–10.3)
Chloride: 101 mmol/L (ref 98–111)
Creatinine, Ser: 0.76 mg/dL (ref 0.44–1.00)
GFR, Estimated: >60 mL/min (ref >60)
Glucose, Bld: 101 mg/dL — ABNORMAL HIGH (ref 70–99)
Potassium: 2.7 mmol/L — CL (ref 3.5–5.1)
Sodium: 138 mmol/L (ref 135–145)

## 2024-01-31 LAB — MAGNESIUM: Magnesium: 1.9 mg/dL (ref 1.7–2.4)

## 2024-01-31 LAB — TROPONIN I (HIGH SENSITIVITY): Troponin I (High Sensitivity): 4 ng/L (ref <18)

## 2024-01-31 MED ORDER — POTASSIUM CHLORIDE CRYS ER 20 MEQ PO TBCR
40.0000 meq | EXTENDED_RELEASE_TABLET | Freq: Once | ORAL | Status: AC
  Administered 2024-01-31: 40 meq via ORAL
  Filled 2024-01-31: qty 2

## 2024-01-31 MED ORDER — POTASSIUM CHLORIDE 10 MEQ/100ML IV SOLN
10.0000 meq | INTRAVENOUS | Status: DC
  Administered 2024-01-31: 10 meq via INTRAVENOUS
  Filled 2024-01-31: qty 100

## 2024-01-31 MED ORDER — KETOROLAC TROMETHAMINE 15 MG/ML IJ SOLN
15.0000 mg | Freq: Once | INTRAMUSCULAR | Status: AC
  Administered 2024-01-31: 15 mg via INTRAVENOUS
  Filled 2024-01-31: qty 1

## 2024-01-31 NOTE — ED Triage Notes (Signed)
 Pt reports ongoing recurrent CP that radiates down the left arm, ShoB, and nausea x 3 weeks
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
## 2024-02-01 LAB — TROPONIN I (HIGH SENSITIVITY): Troponin I (High Sensitivity): 4 ng/L (ref <18)

## 2024-02-01 MED ORDER — POTASSIUM CHLORIDE 10 MEQ/100ML IV SOLN: 10.0000 meq | Freq: Once | INTRAVENOUS | Status: DC

## 2024-02-01 MED ORDER — POTASSIUM CHLORIDE CRYS ER 20 MEQ PO TBCR: 20.0000 meq | EXTENDED_RELEASE_TABLET | Freq: Two times a day (BID) | ORAL | 0 refills | Status: AC

## 2024-02-01 MED ORDER — POTASSIUM CHLORIDE CRYS ER 20 MEQ PO TBCR
20.0000 meq | EXTENDED_RELEASE_TABLET | Freq: Once | ORAL | Status: AC
  Administered 2024-02-01: 20 meq via ORAL
  Filled 2024-02-01: qty 1

## 2024-02-11 DIAGNOSIS — J4530 Mild persistent asthma, uncomplicated: Secondary | ICD-10-CM | POA: Diagnosis not present

## 2024-02-11 DIAGNOSIS — I10 Essential (primary) hypertension: Secondary | ICD-10-CM | POA: Diagnosis not present

## 2024-02-11 DIAGNOSIS — E785 Hyperlipidemia, unspecified: Secondary | ICD-10-CM | POA: Diagnosis not present

## 2024-06-02 DIAGNOSIS — J4530 Mild persistent asthma, uncomplicated: Secondary | ICD-10-CM | POA: Diagnosis not present

## 2024-06-02 DIAGNOSIS — I10 Essential (primary) hypertension: Secondary | ICD-10-CM | POA: Diagnosis not present

## 2024-06-02 DIAGNOSIS — M170 Bilateral primary osteoarthritis of knee: Secondary | ICD-10-CM | POA: Diagnosis not present

## 2024-06-17 DIAGNOSIS — E785 Hyperlipidemia, unspecified: Secondary | ICD-10-CM | POA: Diagnosis not present

## 2024-06-17 DIAGNOSIS — I10 Essential (primary) hypertension: Secondary | ICD-10-CM | POA: Diagnosis not present

## 2024-06-17 DIAGNOSIS — J019 Acute sinusitis, unspecified: Secondary | ICD-10-CM | POA: Diagnosis not present

## 2024-06-17 DIAGNOSIS — J4530 Mild persistent asthma, uncomplicated: Secondary | ICD-10-CM | POA: Diagnosis not present

## 2024-07-03 DIAGNOSIS — J4530 Mild persistent asthma, uncomplicated: Secondary | ICD-10-CM | POA: Diagnosis not present

## 2024-07-03 DIAGNOSIS — M170 Bilateral primary osteoarthritis of knee: Secondary | ICD-10-CM | POA: Diagnosis not present

## 2024-07-03 DIAGNOSIS — I10 Essential (primary) hypertension: Secondary | ICD-10-CM | POA: Diagnosis not present

## 2024-08-03 DIAGNOSIS — I10 Essential (primary) hypertension: Secondary | ICD-10-CM | POA: Diagnosis not present

## 2024-08-03 DIAGNOSIS — M170 Bilateral primary osteoarthritis of knee: Secondary | ICD-10-CM | POA: Diagnosis not present

## 2024-08-03 DIAGNOSIS — J4530 Mild persistent asthma, uncomplicated: Secondary | ICD-10-CM | POA: Diagnosis not present

## 2024-08-27 DIAGNOSIS — E559 Vitamin D deficiency, unspecified: Secondary | ICD-10-CM | POA: Diagnosis not present

## 2024-08-27 DIAGNOSIS — J4530 Mild persistent asthma, uncomplicated: Secondary | ICD-10-CM | POA: Diagnosis not present

## 2024-08-27 DIAGNOSIS — Z0000 Encounter for general adult medical examination without abnormal findings: Secondary | ICD-10-CM | POA: Diagnosis not present

## 2024-08-27 DIAGNOSIS — I77810 Thoracic aortic ectasia: Secondary | ICD-10-CM | POA: Diagnosis not present

## 2024-08-27 DIAGNOSIS — M8588 Other specified disorders of bone density and structure, other site: Secondary | ICD-10-CM | POA: Diagnosis not present

## 2024-08-27 DIAGNOSIS — F3341 Major depressive disorder, recurrent, in partial remission: Secondary | ICD-10-CM | POA: Diagnosis not present

## 2024-08-27 DIAGNOSIS — I10 Essential (primary) hypertension: Secondary | ICD-10-CM | POA: Diagnosis not present

## 2024-08-27 DIAGNOSIS — F419 Anxiety disorder, unspecified: Secondary | ICD-10-CM | POA: Diagnosis not present

## 2024-08-27 DIAGNOSIS — G4700 Insomnia, unspecified: Secondary | ICD-10-CM | POA: Diagnosis not present

## 2024-08-27 DIAGNOSIS — E785 Hyperlipidemia, unspecified: Secondary | ICD-10-CM | POA: Diagnosis not present

## 2024-08-29 DIAGNOSIS — M8589 Other specified disorders of bone density and structure, multiple sites: Secondary | ICD-10-CM | POA: Diagnosis not present

## 2024-08-29 DIAGNOSIS — Z1231 Encounter for screening mammogram for malignant neoplasm of breast: Secondary | ICD-10-CM | POA: Diagnosis not present

## 2024-09-02 DIAGNOSIS — M170 Bilateral primary osteoarthritis of knee: Secondary | ICD-10-CM | POA: Diagnosis not present

## 2024-09-02 DIAGNOSIS — F3341 Major depressive disorder, recurrent, in partial remission: Secondary | ICD-10-CM | POA: Diagnosis not present

## 2024-09-02 DIAGNOSIS — I10 Essential (primary) hypertension: Secondary | ICD-10-CM | POA: Diagnosis not present

## 2024-09-02 DIAGNOSIS — J4530 Mild persistent asthma, uncomplicated: Secondary | ICD-10-CM | POA: Diagnosis not present

## 2024-09-04 ENCOUNTER — Other Ambulatory Visit: Payer: Self-pay | Admitting: Family Medicine

## 2024-09-04 DIAGNOSIS — I77810 Thoracic aortic ectasia: Secondary | ICD-10-CM

## 2024-09-04 DIAGNOSIS — M170 Bilateral primary osteoarthritis of knee: Secondary | ICD-10-CM | POA: Diagnosis not present

## 2024-09-10 ENCOUNTER — Inpatient Hospital Stay: Admission: RE | Admit: 2024-09-10 | Source: Ambulatory Visit

## 2024-09-11 DIAGNOSIS — K1120 Sialoadenitis, unspecified: Secondary | ICD-10-CM | POA: Diagnosis not present

## 2024-09-11 DIAGNOSIS — M8588 Other specified disorders of bone density and structure, other site: Secondary | ICD-10-CM | POA: Diagnosis not present

## 2024-10-09 ENCOUNTER — Ambulatory Visit
Admission: RE | Admit: 2024-10-09 | Discharge: 2024-10-09 | Disposition: A | Discharge status: Home / Self Care | Source: Ambulatory Visit | Attending: Family Medicine | Admitting: Family Medicine

## 2024-10-09 DIAGNOSIS — I77810 Thoracic aortic ectasia: Secondary | ICD-10-CM

## 2024-10-09 MED ORDER — IOPAMIDOL (ISOVUE-370) INJECTION 76%
75.0000 mL | Freq: Once | INTRAVENOUS | Status: AC | PRN
  Administered 2024-10-09: 75 mL via INTRAVENOUS

## 2024-10-13 ENCOUNTER — Institutional Professional Consult (permissible substitution) (INDEPENDENT_AMBULATORY_CARE_PROVIDER_SITE_OTHER)

## 2024-11-04 ENCOUNTER — Encounter (INDEPENDENT_AMBULATORY_CARE_PROVIDER_SITE_OTHER): Payer: Self-pay

## 2024-11-04 ENCOUNTER — Ambulatory Visit (INDEPENDENT_AMBULATORY_CARE_PROVIDER_SITE_OTHER)

## 2024-11-04 VITALS — BP 130/89 | HR 57 | Temp 97.4°F | Ht 66.0 in | Wt 185.0 lb

## 2024-11-04 DIAGNOSIS — H9313 Tinnitus, bilateral: Secondary | ICD-10-CM

## 2024-11-04 DIAGNOSIS — H905 Unspecified sensorineural hearing loss: Secondary | ICD-10-CM

## 2024-11-04 DIAGNOSIS — K1120 Sialoadenitis, unspecified: Secondary | ICD-10-CM | POA: Diagnosis not present
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
## 2024-12-29 ENCOUNTER — Ambulatory Visit (HOSPITAL_COMMUNITY): Admit: 2024-12-29 | Admitting: Orthopedic Surgery

## 2024-12-30 ENCOUNTER — Ambulatory Visit (INDEPENDENT_AMBULATORY_CARE_PROVIDER_SITE_OTHER)

## 2024-12-30 ENCOUNTER — Ambulatory Visit (INDEPENDENT_AMBULATORY_CARE_PROVIDER_SITE_OTHER): Admitting: Audiology

## 2024-12-30 ENCOUNTER — Ambulatory Visit (INDEPENDENT_AMBULATORY_CARE_PROVIDER_SITE_OTHER): Admitting: Otolaryngology

## 2024-12-30 ENCOUNTER — Telehealth (INDEPENDENT_AMBULATORY_CARE_PROVIDER_SITE_OTHER): Payer: Self-pay

## 2024-12-30 NOTE — Telephone Encounter (Signed)
 Patient did not show for f/up appointment today. I also saw that she has not had CT scan of neck done. I called to see if patient is better and not needing visit or ct scan. I did leave # for CT scan scheduling if she still needs it done. And I asked that patient call the office to get reschedule for f/up after scan is done, if she still needs it. I left office # and my name for her to call back.

## 2025-02-16 ENCOUNTER — Ambulatory Visit (INDEPENDENT_AMBULATORY_CARE_PROVIDER_SITE_OTHER): Admitting: Audiology

## 2025-02-16 ENCOUNTER — Ambulatory Visit (INDEPENDENT_AMBULATORY_CARE_PROVIDER_SITE_OTHER)
